# Patient Record
Sex: Female | Born: 1965 | Race: White | Hispanic: No | Marital: Married | Smoking: Former smoker
Health system: Southern US, Community
[De-identification: ages and names within clinical notes are randomized; demographics above are authoritative.]

## PROBLEM LIST (undated history)

## (undated) DIAGNOSIS — E079 Disorder of thyroid, unspecified: Secondary | ICD-10-CM

## (undated) DIAGNOSIS — F32A Depression, unspecified: Secondary | ICD-10-CM

## (undated) DIAGNOSIS — R51 Headache: Secondary | ICD-10-CM

## (undated) DIAGNOSIS — E785 Hyperlipidemia, unspecified: Secondary | ICD-10-CM

## (undated) DIAGNOSIS — R519 Headache, unspecified: Secondary | ICD-10-CM

## (undated) DIAGNOSIS — T7840XA Allergy, unspecified, initial encounter: Secondary | ICD-10-CM

## (undated) DIAGNOSIS — F329 Major depressive disorder, single episode, unspecified: Secondary | ICD-10-CM

## (undated) DIAGNOSIS — I10 Essential (primary) hypertension: Secondary | ICD-10-CM

## (undated) DIAGNOSIS — K219 Gastro-esophageal reflux disease without esophagitis: Secondary | ICD-10-CM

## (undated) DIAGNOSIS — E039 Hypothyroidism, unspecified: Secondary | ICD-10-CM

## (undated) DIAGNOSIS — B019 Varicella without complication: Secondary | ICD-10-CM

## (undated) HISTORY — DX: Depression, unspecified: F32.A

## (undated) HISTORY — DX: Disorder of thyroid, unspecified: E07.9

## (undated) HISTORY — DX: Headache, unspecified: R51.9

## (undated) HISTORY — DX: Essential (primary) hypertension: I10

## (undated) HISTORY — PX: CYSTECTOMY: SUR359

## (undated) HISTORY — DX: Major depressive disorder, single episode, unspecified: F32.9

## (undated) HISTORY — DX: Allergy, unspecified, initial encounter: T78.40XA

## (undated) HISTORY — DX: Gastro-esophageal reflux disease without esophagitis: K21.9

## (undated) HISTORY — DX: Headache: R51

## (undated) HISTORY — DX: Varicella without complication: B01.9

## (undated) HISTORY — DX: Hyperlipidemia, unspecified: E78.5

---

## 2008-05-30 ENCOUNTER — Encounter: Payer: Self-pay | Admitting: Family Medicine

## 2008-05-30 ENCOUNTER — Emergency Department (HOSPITAL_COMMUNITY): Admission: EM | Admit: 2008-05-30 | Discharge: 2008-05-31 | Payer: Self-pay | Admitting: Emergency Medicine

## 2008-06-08 ENCOUNTER — Ambulatory Visit: Payer: Self-pay | Admitting: Family Medicine

## 2008-06-08 DIAGNOSIS — R32 Unspecified urinary incontinence: Secondary | ICD-10-CM | POA: Insufficient documentation

## 2008-06-08 DIAGNOSIS — R51 Headache: Secondary | ICD-10-CM

## 2008-06-08 DIAGNOSIS — K219 Gastro-esophageal reflux disease without esophagitis: Secondary | ICD-10-CM | POA: Insufficient documentation

## 2008-06-08 DIAGNOSIS — R519 Headache, unspecified: Secondary | ICD-10-CM | POA: Insufficient documentation

## 2008-06-15 ENCOUNTER — Ambulatory Visit: Payer: Self-pay | Admitting: Family Medicine

## 2008-06-16 LAB — CONVERTED CEMR LAB
Cholesterol: 196 mg/dL (ref 0–200)
LDL Cholesterol: 130 mg/dL — ABNORMAL HIGH (ref 0–99)
Triglycerides: 93 mg/dL (ref 0.0–149.0)

## 2008-06-22 ENCOUNTER — Encounter: Payer: Self-pay | Admitting: Family Medicine

## 2008-06-22 ENCOUNTER — Other Ambulatory Visit: Admission: RE | Admit: 2008-06-22 | Discharge: 2008-06-22 | Payer: Self-pay | Admitting: Family Medicine

## 2008-06-22 ENCOUNTER — Ambulatory Visit: Payer: Self-pay | Admitting: Family Medicine

## 2008-06-22 DIAGNOSIS — E039 Hypothyroidism, unspecified: Secondary | ICD-10-CM

## 2008-06-25 ENCOUNTER — Encounter: Payer: Self-pay | Admitting: Family Medicine

## 2010-06-07 LAB — CBC
MCHC: 33.6 g/dL (ref 30.0–36.0)
MCV: 95.4 fL (ref 78.0–100.0)
Platelets: 332 10*3/uL (ref 150–400)
RDW: 12.6 % (ref 11.5–15.5)

## 2010-06-07 LAB — COMPREHENSIVE METABOLIC PANEL
AST: 17 U/L (ref 0–37)
Albumin: 3.4 g/dL — ABNORMAL LOW (ref 3.5–5.2)
CO2: 27 mEq/L (ref 19–32)
Calcium: 8.9 mg/dL (ref 8.4–10.5)
Creatinine, Ser: 0.86 mg/dL (ref 0.4–1.2)
GFR calc Af Amer: >60 mL/min (ref >60)
GFR calc non Af Amer: >60 mL/min (ref >60)
Total Protein: 6.3 g/dL (ref 6.0–8.3)

## 2010-06-07 LAB — URINALYSIS, ROUTINE W REFLEX MICROSCOPIC
Bilirubin Urine: NEGATIVE
Ketones, ur: NEGATIVE mg/dL
Nitrite: NEGATIVE
Protein, ur: NEGATIVE mg/dL
Urobilinogen, UA: 0.2 mg/dL (ref 0.0–1.0)

## 2010-06-07 LAB — DIFFERENTIAL
Eosinophils Absolute: 0.2 10*3/uL (ref 0.0–0.7)
Lymphocytes Relative: 21 % (ref 12–46)
Lymphs Abs: 2 10*3/uL (ref 0.7–4.0)
Monocytes Relative: 7 % (ref 3–12)
Neutro Abs: 6.6 10*3/uL (ref 1.7–7.7)
Neutrophils Relative %: 70 % (ref 43–77)

## 2010-06-07 LAB — LIPASE, BLOOD: Lipase: 21 U/L (ref 11–59)

## 2012-07-09 ENCOUNTER — Ambulatory Visit (INDEPENDENT_AMBULATORY_CARE_PROVIDER_SITE_OTHER): Payer: BC Managed Care – PPO | Admitting: Family Medicine

## 2012-07-09 ENCOUNTER — Encounter: Payer: Self-pay | Admitting: Family Medicine

## 2012-07-09 VITALS — BP 130/80 | HR 86 | Temp 97.9°F | Ht 63.25 in | Wt 297.8 lb

## 2012-07-09 DIAGNOSIS — Z0000 Encounter for general adult medical examination without abnormal findings: Secondary | ICD-10-CM

## 2012-07-09 DIAGNOSIS — E039 Hypothyroidism, unspecified: Secondary | ICD-10-CM

## 2012-07-09 DIAGNOSIS — F341 Dysthymic disorder: Secondary | ICD-10-CM

## 2012-07-09 DIAGNOSIS — Z1231 Encounter for screening mammogram for malignant neoplasm of breast: Secondary | ICD-10-CM

## 2012-07-09 DIAGNOSIS — F418 Other specified anxiety disorders: Secondary | ICD-10-CM

## 2012-07-09 LAB — CBC WITH DIFFERENTIAL/PLATELET
Basophils Absolute: 0.1 10*3/uL (ref 0.0–0.1)
Eosinophils Absolute: 0.3 10*3/uL (ref 0.0–0.7)
HCT: 41 % (ref 36.0–46.0)
Hemoglobin: 13.9 g/dL (ref 12.0–15.0)
Lymphocytes Relative: 23.6 % (ref 12.0–46.0)
MCHC: 34 g/dL (ref 30.0–36.0)
MCV: 92.1 fl (ref 78.0–100.0)
Neutro Abs: 6.9 10*3/uL (ref 1.4–7.7)
Neutrophils Relative %: 65.5 % (ref 43.0–77.0)
Platelets: 388 10*3/uL (ref 150.0–400.0)
RBC: 4.45 Mil/uL (ref 3.87–5.11)
RDW: 13.6 % (ref 11.5–14.6)
WBC: 10.5 10*3/uL (ref 4.5–10.5)

## 2012-07-09 LAB — LIPID PANEL
Cholesterol: 211 mg/dL — ABNORMAL HIGH (ref 0–200)
Total CHOL/HDL Ratio: 4
VLDL: 22.2 mg/dL (ref 0.0–40.0)

## 2012-07-09 LAB — BASIC METABOLIC PANEL
Chloride: 104 mEq/L (ref 96–112)
Creatinine, Ser: 0.8 mg/dL (ref 0.4–1.2)
Potassium: 4.4 mEq/L (ref 3.5–5.1)

## 2012-07-09 LAB — HEPATIC FUNCTION PANEL
ALT: 17 U/L (ref 0–35)
Bilirubin, Direct: 0.1 mg/dL (ref 0.0–0.3)
Total Bilirubin: 0.5 mg/dL (ref 0.3–1.2)

## 2012-07-09 MED ORDER — VENLAFAXINE HCL ER 37.5 MG PO CP24: 37.5000 mg | ORAL_CAPSULE | Freq: Every day | ORAL | Status: DC

## 2012-07-09 NOTE — Assessment & Plan Note (Signed)
New to provider, chronic for pt.  Not currently on meds.  Check labs.  Start synthroid prn.

## 2012-07-09 NOTE — Progress Notes (Signed)
  Subjective:    Patient ID: Jody Schultz, female    DOB: 1966-01-17, 47 y.o.   MRN: [id number]  HPI New to establish.  Previous MD- Clent Ridges (last seen 4+ yrs ago)  Pap- 4 yrs ago (due but having period) Mammo- never  Obesity- pt reports she's 'working really really hard'.  Has lost 15 lbs.  Walking 'at least 3x/week', weight watchers online.  Has hx of hypothyroid, not currently on meds.  Sleep disturbance- 'i can fall asleep fine but i can't stay asleep'.  This has been an issue for years.  In the past month, things have seemingly improved.  Pt is concerned that menopause is playing a role.  Periods are changing in consistency and duration x6 months.  Pt admits to frequent tearfulness, short tempered, anxious- this has been an ongoing issue.  No hot flashes.  + low energy.   Review of Systems Patient reports no vision/hearing changes, adenopathy,fever, weight change,  persistant/recurrent hoarseness , swallowing issues, chest pain, palpitations, edema, persistant/recurrent cough, hemoptysis, dyspnea (rest/exertional/paroxysmal nocturnal), gastrointestinal bleeding (melena, rectal bleeding), abdominal pain, significant heartburn, bowel changes, GU symptoms (dysuria, hematuria, incontinence), Gyn symptoms (abnormal  bleeding, pain),  syncope, focal weakness, memory loss, numbness & tingling, skin/hair/nail changes, abnormal bruising or bleeding.     Objective:   Physical Exam General Appearance:    Alert, cooperative, no distress, appears stated age  Head:    Normocephalic, without obvious abnormality, atraumatic  Eyes:    PERRL, conjunctiva/corneas clear, EOM's intact, fundi    benign, both eyes  Ears:    Normal TM's and external ear canals, both ears  Nose:   Nares normal, septum midline, mucosa normal, no drainage    or sinus tenderness  Throat:   Lips, mucosa, and tongue normal; teeth and gums normal  Neck:   Supple, symmetrical, trachea midline, no adenopathy;    Thyroid: no  enlargement/tenderness/nodules  Back:     Symmetric, no curvature, ROM normal, no CVA tenderness  Lungs:     Clear to auscultation bilaterally, respirations unlabored  Chest Wall:    No tenderness or deformity   Heart:    Regular rate and rhythm, S1 and S2 normal, no murmur, rub   or gallop  Breast Exam:    Deferred to GYN  Abdomen:     Soft, non-tender, bowel sounds active all four quadrants,    no masses, no organomegaly  Genitalia:    Deferred to GYN  Rectal:    Extremities:   Extremities normal, atraumatic, no cyanosis or edema  Pulses:   2+ and symmetric all extremities  Skin:   Skin color, texture, turgor normal, no rashes or lesions  Lymph nodes:   Cervical, supraclavicular, and axillary nodes normal  Neurologic:   CNII-XII intact, normal strength, sensation and reflexes    throughout          Assessment & Plan:

## 2012-07-09 NOTE — Assessment & Plan Note (Addendum)
New.  Pt tearful when talking about her recent mood and sleep difficulties.  This has been ongoing for years.  Has never been addressed.  Start SNRI.  Will follow closely.

## 2012-07-09 NOTE — Patient Instructions (Addendum)
Follow up in 4-6 weeks for pap We'll notify you of your lab results Start the Venlafaxine daily for mood Keep up the good work on the weight loss Call with any questions or concerns Hang in there!

## 2012-07-09 NOTE — Assessment & Plan Note (Signed)
Pt's PE WNL w/ exception of obesity.  Check labs.  Refer for mammo.  Will do pap and breast exam at upcoming visit due to menses.  Anticipatory guidance provided.

## 2012-07-09 NOTE — Assessment & Plan Note (Signed)
New.  Pt has been working on [employer] and exercise but is having difficulty losing weight.  Check TSH to r/o thyroid abnormality.  Applauded recent efforts.  Will follow.

## 2012-07-10 ENCOUNTER — Telehealth: Payer: Self-pay | Admitting: *Deleted

## 2012-07-10 DIAGNOSIS — E039 Hypothyroidism, unspecified: Secondary | ICD-10-CM

## 2012-07-10 DIAGNOSIS — E785 Hyperlipidemia, unspecified: Secondary | ICD-10-CM

## 2012-07-10 MED ORDER — LEVOTHYROXINE SODIUM 100 MCG PO TABS: 100.0000 ug | ORAL_TABLET | Freq: Every day | ORAL | Status: DC

## 2012-07-10 NOTE — Telephone Encounter (Signed)
Message copied by Verdie Shire on Thu Jul 10, 2012  3:15 PM ------      Message from: Sheliah Hatch      Created: Wed Jul 09, 2012  1:03 PM       TSH is at upper limits of normal- based on this, should start Synthroid daily      Total cholesterol and LDL are elevated but this should improve as thyroid level does.  Will repeat in 6 months      B12 is at low end- should start oral supplement daily      Remainder of labs look good ------

## 2012-07-10 NOTE — Telephone Encounter (Signed)
Spoke with the pt and informed her of recent lab results and recommendations per Dr. Beverely Low.  Pt understood and agreed.  New rx sent to the pharmacy by e-script, and labs ordered and sent.  Pt will need repeat TSH in 3 mos and repeat Lipid and LFT's in 6 mos. //AB/CMA

## 2012-07-13 LAB — VITAMIN D 1,25 DIHYDROXY: Vitamin D2 1, 25 (OH)2: <8 pg/mL

## 2012-07-14 ENCOUNTER — Encounter: Payer: Self-pay | Admitting: Family Medicine

## 2012-07-18 ENCOUNTER — Ambulatory Visit: Payer: Self-pay | Admitting: Family Medicine

## 2012-08-07 ENCOUNTER — Encounter: Payer: Self-pay | Admitting: Family Medicine

## 2012-08-07 DIAGNOSIS — F418 Other specified anxiety disorders: Secondary | ICD-10-CM

## 2012-08-07 NOTE — Telephone Encounter (Signed)
Ok to increase to 75mg  Venlafaxine daily, #30, 3 refills

## 2012-08-07 NOTE — Telephone Encounter (Signed)
Please advise.//AB/CMA 

## 2012-08-12 NOTE — Telephone Encounter (Signed)
Pt called stating she is completely out of this medication. Can we please send this to [facility] [address]?

## 2012-08-14 MED ORDER — VENLAFAXINE HCL ER 37.5 MG PO CP24: 75.0000 mg | ORAL_CAPSULE | Freq: Every day | ORAL | Status: DC

## 2012-08-14 NOTE — Telephone Encounter (Signed)
Rx sent 

## 2012-08-25 ENCOUNTER — Other Ambulatory Visit (HOSPITAL_COMMUNITY)
Admission: RE | Admit: 2012-08-25 | Discharge: 2012-08-25 | Disposition: A | Discharge status: Home / Self Care | Payer: BC Managed Care – PPO | Source: Ambulatory Visit | Attending: Family Medicine | Admitting: Family Medicine

## 2012-08-25 ENCOUNTER — Ambulatory Visit (INDEPENDENT_AMBULATORY_CARE_PROVIDER_SITE_OTHER): Payer: BC Managed Care – PPO | Admitting: Family Medicine

## 2012-08-25 ENCOUNTER — Encounter: Payer: Self-pay | Admitting: Family Medicine

## 2012-08-25 ENCOUNTER — Ambulatory Visit
Admission: RE | Admit: 2012-08-25 | Discharge: 2012-08-25 | Disposition: A | Discharge status: Home / Self Care | Payer: BC Managed Care – PPO | Source: Ambulatory Visit | Attending: Family Medicine | Admitting: Family Medicine

## 2012-08-25 VITALS — BP 160/90 | HR 75 | Temp 98.1°F | Ht 63.25 in | Wt 299.0 lb

## 2012-08-25 DIAGNOSIS — R030 Elevated blood-pressure reading, without diagnosis of hypertension: Secondary | ICD-10-CM

## 2012-08-25 DIAGNOSIS — F418 Other specified anxiety disorders: Secondary | ICD-10-CM

## 2012-08-25 DIAGNOSIS — Z1151 Encounter for screening for human papillomavirus (HPV): Secondary | ICD-10-CM | POA: Insufficient documentation

## 2012-08-25 DIAGNOSIS — Z01419 Encounter for gynecological examination (general) (routine) without abnormal findings: Secondary | ICD-10-CM | POA: Insufficient documentation

## 2012-08-25 DIAGNOSIS — F341 Dysthymic disorder: Secondary | ICD-10-CM

## 2012-08-25 DIAGNOSIS — Z124 Encounter for screening for malignant neoplasm of cervix: Secondary | ICD-10-CM

## 2012-08-25 DIAGNOSIS — Z1231 Encounter for screening mammogram for malignant neoplasm of breast: Secondary | ICD-10-CM

## 2012-08-25 MED ORDER — VENLAFAXINE HCL ER 75 MG PO TB24: 1.0000 | ORAL_TABLET | Freq: Every day | ORAL | Status: DC

## 2012-08-25 NOTE — Progress Notes (Signed)
  Subjective:    Patient ID: Jody Schultz, female    DOB: 1965/06/25, 47 y.o.   MRN: [id number]  HPI Breast exam/pap- due today.  Pt used OTC monistat cream last night due to yeast infxn from recent abx use (multiple styes)  Depression/anxiety- 'huge stress in my life'.  Father is not doing well, work and home are stressful.  Pt is not exercising, eating well.  Pt wanted to increase meds to 75mg - this was approved on 6/12 but due to prescription problems, she remains on 37.5mg  daily.  Elevated BP- pt reports this is due to increased stress.  Denies CP, SOB, visual changes, edema.  + HAs   Review of Systems For ROS see HPI     Objective:   Physical Exam  Vitals reviewed. Constitutional: She appears well-developed and well-nourished. No distress.  Cardiovascular: Normal rate, regular rhythm and normal heart sounds.   Pulmonary/Chest: Effort normal and breath sounds normal. No respiratory distress. She has no wheezes. She has no rales. Right breast exhibits no inverted nipple, no mass, no nipple discharge, no skin change and no tenderness. Left breast exhibits no inverted nipple, no mass, no nipple discharge, no skin change and no tenderness.  Genitourinary: Vagina normal and uterus normal. No breast swelling, tenderness, discharge or bleeding. There is no rash, tenderness or lesion on the right labia. There is no rash, tenderness or lesion on the left labia. Uterus is not deviated, not enlarged, not fixed and not tender. Cervix exhibits no motion tenderness, no discharge and no friability. Right adnexum displays no mass, no tenderness and no fullness. Left adnexum displays no mass, no tenderness and no fullness. No erythema or tenderness around the vagina. No foreign body around the vagina. No vaginal discharge found.  Skin: Skin is warm and dry.  Psychiatric:  anxious          Assessment & Plan:

## 2012-08-25 NOTE — Assessment & Plan Note (Signed)
New.  Asymptomatic.  Pt feels this is stress related.  Will have pt monitor BP at home/work and call if persistently >140/90.  Pt expressed understanding and is in agreement w/ plan.

## 2012-08-25 NOTE — Patient Instructions (Addendum)
Follow up in 3 months to recheck depression, weight loss We'll notify you of your pap results Increase the Effexor to 75mg  daily- 2 of your current script and 1 of the new prescription Call with any questions or concerns Hang in there!

## 2012-08-25 NOTE — Assessment & Plan Note (Signed)
Unchanged.  Pt did not increase dose as directed.  New script sent.  Will follow.

## 2012-08-25 NOTE — Assessment & Plan Note (Signed)
Pap collected. 

## 2012-08-28 ENCOUNTER — Other Ambulatory Visit: Payer: Self-pay | Admitting: Medical

## 2012-08-28 ENCOUNTER — Other Ambulatory Visit: Payer: Self-pay | Admitting: Family Medicine

## 2012-08-28 DIAGNOSIS — R928 Other abnormal and inconclusive findings on diagnostic imaging of breast: Secondary | ICD-10-CM

## 2012-09-01 ENCOUNTER — Ambulatory Visit
Admission: RE | Admit: 2012-09-01 | Discharge: 2012-09-01 | Disposition: A | Discharge status: Home / Self Care | Payer: BC Managed Care – PPO | Source: Ambulatory Visit | Attending: Family Medicine | Admitting: Family Medicine

## 2012-09-01 DIAGNOSIS — R928 Other abnormal and inconclusive findings on diagnostic imaging of breast: Secondary | ICD-10-CM

## 2012-09-09 ENCOUNTER — Other Ambulatory Visit: Payer: BC Managed Care – PPO

## 2012-09-11 ENCOUNTER — Other Ambulatory Visit: Payer: BC Managed Care – PPO

## 2013-01-01 ENCOUNTER — Other Ambulatory Visit: Payer: Self-pay

## 2013-04-11 ENCOUNTER — Other Ambulatory Visit: Payer: Self-pay | Admitting: Family Medicine

## 2013-04-11 NOTE — Telephone Encounter (Signed)
Med filled.  

## 2016-09-19 ENCOUNTER — Emergency Department (HOSPITAL_COMMUNITY): Payer: 59

## 2016-09-19 ENCOUNTER — Other Ambulatory Visit: Payer: Self-pay

## 2016-09-19 ENCOUNTER — Observation Stay (HOSPITAL_BASED_OUTPATIENT_CLINIC_OR_DEPARTMENT_OTHER): Payer: 59

## 2016-09-19 ENCOUNTER — Encounter (HOSPITAL_COMMUNITY): Payer: Self-pay | Admitting: Emergency Medicine

## 2016-09-19 ENCOUNTER — Other Ambulatory Visit: Payer: Self-pay | Admitting: Physician Assistant

## 2016-09-19 ENCOUNTER — Observation Stay (HOSPITAL_COMMUNITY)
Admission: EM | Admit: 2016-09-19 | Discharge: 2016-09-19 | Disposition: A | Discharge status: Home / Self Care | Payer: 59 | Attending: Internal Medicine | Admitting: Internal Medicine

## 2016-09-19 DIAGNOSIS — R030 Elevated blood-pressure reading, without diagnosis of hypertension: Secondary | ICD-10-CM | POA: Diagnosis present

## 2016-09-19 DIAGNOSIS — K219 Gastro-esophageal reflux disease without esophagitis: Secondary | ICD-10-CM | POA: Diagnosis present

## 2016-09-19 DIAGNOSIS — F329 Major depressive disorder, single episode, unspecified: Secondary | ICD-10-CM | POA: Insufficient documentation

## 2016-09-19 DIAGNOSIS — R079 Chest pain, unspecified: Principal | ICD-10-CM | POA: Diagnosis present

## 2016-09-19 DIAGNOSIS — Z87891 Personal history of nicotine dependence: Secondary | ICD-10-CM | POA: Diagnosis not present

## 2016-09-19 DIAGNOSIS — I10 Essential (primary) hypertension: Secondary | ICD-10-CM | POA: Insufficient documentation

## 2016-09-19 DIAGNOSIS — E038 Other specified hypothyroidism: Secondary | ICD-10-CM

## 2016-09-19 DIAGNOSIS — Z79899 Other long term (current) drug therapy: Secondary | ICD-10-CM | POA: Diagnosis not present

## 2016-09-19 DIAGNOSIS — M79602 Pain in left arm: Secondary | ICD-10-CM | POA: Diagnosis present

## 2016-09-19 DIAGNOSIS — R072 Precordial pain: Secondary | ICD-10-CM

## 2016-09-19 DIAGNOSIS — E039 Hypothyroidism, unspecified: Secondary | ICD-10-CM | POA: Diagnosis present

## 2016-09-19 LAB — CREATININE, SERUM
Creatinine, Ser: 0.79 mg/dL (ref 0.44–1.00)
GFR calc Af Amer: >60 mL/min (ref >60)

## 2016-09-19 LAB — BASIC METABOLIC PANEL
Anion gap: 8 (ref 5–15)
BUN: 16 mg/dL (ref 6–20)
CHLORIDE: 104 mmol/L (ref 101–111)
CO2: 26 mmol/L (ref 22–32)
Calcium: 9.1 mg/dL (ref 8.9–10.3)
Creatinine, Ser: 0.81 mg/dL (ref 0.44–1.00)
GFR calc non Af Amer: >60 mL/min (ref >60)
Glucose, Bld: 118 mg/dL — ABNORMAL HIGH (ref 65–99)
POTASSIUM: 4.2 mmol/L (ref 3.5–5.1)
SODIUM: 138 mmol/L (ref 135–145)

## 2016-09-19 LAB — TROPONIN I
TROPONIN I: 0.07 ng/mL — AB (ref <0.03)
Troponin I: <0.03 ng/mL (ref <0.03)

## 2016-09-19 LAB — CBC
HCT: 40.1 % (ref 36.0–46.0)
HEMATOCRIT: 43 % (ref 36.0–46.0)
HEMOGLOBIN: 13.9 g/dL (ref 12.0–15.0)
Hemoglobin: 13.1 g/dL (ref 12.0–15.0)
MCH: 30 pg (ref 26.0–34.0)
MCH: 30.1 pg (ref 26.0–34.0)
MCHC: 32.3 g/dL (ref 30.0–36.0)
MCHC: 32.7 g/dL (ref 30.0–36.0)
MCV: 92.7 fL (ref 78.0–100.0)
MCV: 92.2 fL (ref 78.0–100.0)
PLATELETS: 325 10*3/uL (ref 150–400)
Platelets: 336 10*3/uL (ref 150–400)
RBC: 4.64 MIL/uL (ref 3.87–5.11)
RBC: 4.35 MIL/uL (ref 3.87–5.11)
RDW: 13.1 % (ref 11.5–15.5)
RDW: 13.2 % (ref 11.5–15.5)
WBC: 11.8 10*3/uL — AB (ref 4.0–10.5)
WBC: 10.1 10*3/uL (ref 4.0–10.5)

## 2016-09-19 LAB — HIV ANTIBODY (ROUTINE TESTING W REFLEX): HIV SCREEN 4TH GENERATION: NONREACTIVE

## 2016-09-19 LAB — ECHOCARDIOGRAM COMPLETE
Height: 62 in
WEIGHTICAEL: 4640 [oz_av]

## 2016-09-19 LAB — T4, FREE: Free T4: 0.68 ng/dL (ref 0.61–1.12)

## 2016-09-19 LAB — I-STAT TROPONIN, ED: Troponin i, poc: 0 ng/mL (ref 0.00–0.08)

## 2016-09-19 LAB — TSH: TSH: 18.05 u[IU]/mL — ABNORMAL HIGH (ref 0.350–4.500)

## 2016-09-19 MED ORDER — ASPIRIN EC 325 MG PO TBEC
325.0000 mg | DELAYED_RELEASE_TABLET | Freq: Every day | ORAL | Status: DC
  Administered 2016-09-19: 325 mg via ORAL
  Filled 2016-09-19: qty 1

## 2016-09-19 MED ORDER — ENOXAPARIN SODIUM 40 MG/0.4ML ~~LOC~~ SOLN: 40.0000 mg | SUBCUTANEOUS | Status: DC

## 2016-09-19 MED ORDER — AMLODIPINE BESYLATE 5 MG PO TABS: 5.0000 mg | ORAL_TABLET | Freq: Every day | ORAL | 0 refills | Status: AC

## 2016-09-19 MED ORDER — LEVOTHYROXINE SODIUM 50 MCG PO TABS
50.0000 ug | ORAL_TABLET | Freq: Every day | ORAL | 0 refills | Status: DC
Start: 2016-09-20 — End: 2019-06-24

## 2016-09-19 MED ORDER — NITROGLYCERIN 0.4 MG SL SUBL: 0.4000 mg | SUBLINGUAL_TABLET | SUBLINGUAL | 0 refills | Status: DC | PRN

## 2016-09-19 MED ORDER — ACETAMINOPHEN 325 MG PO TABS: 650.0000 mg | ORAL_TABLET | ORAL | Status: DC | PRN

## 2016-09-19 MED ORDER — ASPIRIN 81 MG PO CHEW
324.0000 mg | CHEWABLE_TABLET | Freq: Once | ORAL | Status: AC
  Administered 2016-09-19: 324 mg via ORAL
  Filled 2016-09-19: qty 4

## 2016-09-19 MED ORDER — LEVOTHYROXINE SODIUM 50 MCG PO TABS
50.0000 ug | ORAL_TABLET | Freq: Every day | ORAL | Status: DC
  Administered 2016-09-19: 50 ug via ORAL
  Filled 2016-09-19 (×2): qty 1

## 2016-09-19 MED ORDER — ONDANSETRON HCL 4 MG/2ML IJ SOLN: 4.0000 mg | Freq: Four times a day (QID) | INTRAMUSCULAR | Status: DC | PRN

## 2016-09-19 MED ORDER — ASPIRIN EC 81 MG PO TBEC: 81.0000 mg | DELAYED_RELEASE_TABLET | Freq: Every day | ORAL | 0 refills | Status: DC

## 2016-09-19 MED ORDER — NITROGLYCERIN 0.4 MG SL SUBL
0.4000 mg | SUBLINGUAL_TABLET | SUBLINGUAL | Status: DC | PRN
  Administered 2016-09-19: 0.4 mg via SUBLINGUAL
  Filled 2016-09-19 (×2): qty 1

## 2016-09-19 NOTE — Progress Notes (Addendum)
[facility] PROGRESS NOTE  Donna Snooks LZJ:[id number] DOB: 09-Jul-1965 DOA: 09/19/2016  PCP: Midge Minium, MD  Brief History/Interval Summary: 51 year old Caucasian female with a past medical history of hypothyroidism who has not taken her medications in over a year, presented with complaints of chest tightness with pain in the left arm. Patient without any known history of coronary artery disease. She is obese. Noted to have high blood pressure. She was hospitalized for further management.  Reason for Visit: Chest tightness and left arm pain.  Consultants: Cardiology  Procedures: Transthoracic echocardiogram is pending  Antibiotics: None  Subjective/Interval History: Patient feels better this morning. Denies any chest tightness currently. No shortness of breath. No nausea, vomiting currently.  ROS: Denies any headaches.  Objective:  Vital Signs  Vitals:   09/19/16 0600 09/19/16 0615 09/19/16 0726 09/19/16 1030  BP: 133/66 138/72 137/74 (!) 146/76  Pulse: 71 72 77 74  Resp: (!) 22 19 (!) 21 20  Temp:      TempSrc:      SpO2: 97% 96% 98% 97%  Weight:      Height:       No intake or output data in the 24 hours ending 09/19/16 1133 Filed Weights   09/19/16 0305  Weight: 131.5 kg (290 lb)    General appearance: alert, cooperative, appears stated age, no distress and moderately obese Resp: clear to auscultation bilaterally Cardio: regular rate and rhythm, S1, S2 normal, no murmur, click, rub or gallop GI: soft, non-tender; bowel sounds normal; no masses,  no organomegaly Extremities: extremities normal, atraumatic, no cyanosis or edema Neurologic: Awake and alert. Oriented 3. No focal neurological deficits noted.  Lab Results:  Data Reviewed: I have personally reviewed following labs and imaging studies  CBC:  Recent Labs Lab 09/19/16 0312 09/19/16 0637  WBC 11.8* 10.1  HGB 13.9 13.1  HCT 43.0 40.1  MCV 92.7 92.2  PLT 336 325    Basic  Metabolic Panel:  Recent Labs Lab 09/19/16 0312 09/19/16 0603  NA 138  --   K 4.2  --   CL 104  --   CO2 26  --   GLUCOSE 118*  --   BUN 16  --   CREATININE 0.81 0.79  CALCIUM 9.1  --     GFR: Estimated Creatinine Clearance: 107.4 mL/min (by C-G formula based on SCr of 0.79 mg/dL).  Cardiac Enzymes:  Recent Labs Lab 09/19/16 0603  TROPONINI 0.07*    Thyroid Function Tests:  Recent Labs  09/19/16 0603  TSH 18.050*     Radiology Studies: Dg Chest 2 View  Result Date: 09/19/2016 CLINICAL DATA:  Dyspnea and hypertension tonight. Left upper extremity pain. EXAM: CHEST  2 VIEW COMPARISON:  None. FINDINGS: The lungs are clear except for mild curvilinear scarring or atelectasis in the left base. The pulmonary vasculature is normal. Heart size is normal. Hilar and mediastinal contours are unremarkable. There is no pleural effusion. IMPRESSION: No active cardiopulmonary disease. Electronically Signed   By: Andreas Newport M.D.   On: 09/19/2016 03:45     Medications:  Scheduled: . aspirin EC  325 mg Oral Daily  . enoxaparin (LOVENOX) injection  40 mg Subcutaneous Q24H  . levothyroxine  50 mcg Oral Daily   Continuous:  KWI:[id number], nitroGLYCERIN, ondansetron (ZOFRAN) IV  Assessment/Plan:  Principal Problem:   Chest pain Active Problems:   Elevated blood pressure reading    Chest tightness with left arm pain. Symptoms improved with sublingual nitroglycerin. Initial troponin was  normal. Second one is 0.07. Waiting for subsequent troponin levels. Cardiology has been consulted. Continue aspirin. If subsequent troponin level is also high we will initiate IV heparin. Patient is currently symptom free. EKG did not show any concerning ischemic changes. She will benefit from further cardiac ischemic testing. Echocardiogram is pending. Check lipid panel.  Elevated blood pressure. No known history of hypertension. Blood pressures have improved. Continue to monitor  for now.  History of hypothyroidism. She has been off of her medications for over a year. She hasn't seen any physicians in 2 years. TSH noted to be 18. Free T4 is pending. Reinitiate Synthroid.  Morbid obesity Body mass index is 53.04 kg/m.   DVT Prophylaxis: Lovenox    Code Status: Full code  Family Communication: Discussed with the patient  Disposition Plan: Management as outlined above. Await cardiology input. Leave nothing by mouth for now.    LOS: 0 days   [facility] Pager [phone] 09/19/2016, 11:33 AM  If 7PM-7AM, please contact night-coverage at [email or web address], password [facility]

## 2016-09-19 NOTE — Progress Notes (Signed)
*  PRELIMINARY RESULTS* Echocardiogram 2D Echocardiogram has been performed.  Leavy Cella 09/19/2016, 3:17 PM

## 2016-09-19 NOTE — ED Notes (Signed)
Pt ambulated to restroom with no difficulties or other issues. Pt states she feels fine.

## 2016-09-19 NOTE — Progress Notes (Signed)
Discharge instructions included the following:  Discharge medications and possible side effects, follow-up appointments, when to call the physician, dietary suggestions, activity suggestions, and exit care documentation.  Verification of comprehension ascertained by "teach-back" method.  Patient discharged to home via private vehicle with husband.  Escorted to exit via wheelchair accompanied by nurse.

## 2016-09-19 NOTE — ED Provider Notes (Signed)
By signing my name below, I, Ephriam Jenkins, attest that this documentation has been prepared under the direction and in the presence of Liliann File, Delice Bison, DO. Electronically signed, Ephriam Jenkins, ED Scribe. 09/19/16. 3:50 AM.  TIME SEEN: 3:50 AM  CHIEF COMPLAINT: Left arm pain/chest tightness  HPI:  HPI Comments: Jody Schultz is a 51 y.o. female with history of hypertension, obesity, previous tobacco use who presents to the Emergency Department complaining of waxing and waning left arm pain that started just prior to arrival. Pt was woken up from her sleep this evening with pain in her left arm which radiates into her left shoulder and chest, described as "tightness". She stood up and felt slightly dizzy and nauseous. Pt then used her husbands BP machine and reports that her blood pressure was 220/110. Pt describes a feeling of chest tightness but no significant pain currently. Pt has not seen a primary care doctor in almost 2 years, denies any cardiac Hx. No known family Hx of heart disease. Pt is not a current smoker, quite 25 years ago. No previous stress test or cardiac cath. No shortness of breath or diaphoresis. No history of PE or DVT. No lower extremity swelling or pain.  ROS: See HPI Constitutional: no fever  Eyes: no drainage  ENT: no runny nose   Cardiovascular:  + chest pain(tightness)  Resp: no SOB  GI: no vomiting GU: no dysuria Integumentary: no rash  Allergy: no hives  Musculoskeletal: no leg swelling  Neurological: no slurred speech ROS otherwise negative  PAST MEDICAL HISTORY/PAST SURGICAL HISTORY:  Past Medical History:  Diagnosis Date  . Chicken pox   . Depression   . Increased frequency of headaches   . Thyroid disease     MEDICATIONS:  Prior to Admission medications   Medication Sig Start Date End Date Taking? Authorizing Provider  levothyroxine (SYNTHROID, LEVOTHROID) 100 MCG tablet Take 1 tablet (100 mcg total) by mouth daily. 07/10/12   Midge Minium,  MD  venlafaxine XR (EFFEXOR-XR) 37.5 MG 24 hr capsule TAKE TWO CAPSULES BY MOUTH DAILY  04/11/13   Midge Minium, MD    ALLERGIES:  No Known Allergies  SOCIAL HISTORY:  Social History  Substance Use Topics  . Smoking status: Former Smoker    Types: Cigarettes    Quit date: 01/27/1988  . Smokeless tobacco: Not on file  . Alcohol use Yes     Comment: social    FAMILY HISTORY: Family History  Problem Relation Age of Onset  . Arthritis Mother   . Stroke Father   . Cancer Maternal Grandmother   . Cancer Paternal Grandmother     EXAM: BP (!) 168/107 (BP Location: Right Arm)   Pulse 86   Temp 98.3 F (36.8 C) (Oral)   Resp (!) 24   Ht 5\' 2"  (1.575 m)   Wt 290 lb (131.5 kg)   LMP 08/20/2016 (Approximate)   SpO2 99%   BMI 53.04 kg/m  CONSTITUTIONAL: Morbidly obese, alert and oriented and responds appropriately to questions. Appears uncomfortable HEAD: Normocephalic EYES: Conjunctivae clear, pupils appear equal, EOMI ENT: normal nose; moist mucous membranes NECK: Supple, no meningismus, no nuchal rigidity, no LAD  CARD: RRR; S1 and S2 appreciated; no murmurs, no clicks, no rubs, no gallops, 2+ radial pulses bilaterally RESP: Normal chest excursion without splinting or tachypnea; breath sounds clear and equal bilaterally; no wheezes, no rhonchi, no rales, no hypoxia or respiratory distress, speaking full sentences ABD/GI: Normal bowel sounds; non-distended; soft, non-tender, no  rebound, no guarding, no peritoneal signs, no hepatosplenomegaly BACK:  The back appears normal and is non-tender to palpation, there is no CVA tenderness EXT: Normal ROM in all joints; non-tender to palpation; no edema; normal capillary refill; no cyanosis, no calf tenderness or swelling    SKIN: Normal color for age and race; warm; no rash NEURO: Moves all extremities equally PSYCH: The patient's mood and manner are appropriate. Grooming and personal hygiene are appropriate.  MEDICAL DECISION  MAKING: Patient here with chest pain with left arm tightness, nausea, dizziness. Has history of tobacco use, obesity, hypertension. Her heart score is 4. We'll give aspirin, nitroglycerin. Will obtain cardiac labs. Doubt dissection or PE at this time.  ED PROGRESS: Patient is pain-free after nitroglycerin. Troponin negative. Chest x-ray clear. EKG shows no ischemic abnormality. Her heart score 4 with no recent provocative testing I have recommended admission for chest pain rule out and patient and husband agree. Discussed with medicine.   4:20 AM Discussed patient's case with hospitalist, Dr. Hal Hope.  I have recommended admission and patient (and family if present) agree with this plan. Admitting physician will place admission orders.   I reviewed all nursing notes, vitals, pertinent previous records, EKGs, lab and urine results, imaging (as available).    EKG Interpretation  Date/Time:  Wednesday September 19 2016 02:57:39 EDT Ventricular Rate:  85 PR Interval:  138 QRS Duration: 82 QT Interval:  368 QTC Calculation: 437 R Axis:   48 Text Interpretation:  Normal sinus rhythm Normal ECG No old tracing to compare Confirmed by Kupono Marling, Cyril Mourning [phone]) on 09/19/2016 3:16:09 AM       This chart was scribed in my presence and reviewed by me personally.    Nayeli Calvert, Delice Bison, DO 09/19/16 [phone]

## 2016-09-19 NOTE — ED Notes (Signed)
Pt reports L sided CP with radiation to L arm that started "a few hrs ago" Pt also reports SOB and nausea. Checked BP and home and it was high, noted to be hypertensive in triage. States she does not have a hx, but also states she has not been to see a doctor in several years

## 2016-09-19 NOTE — Progress Notes (Signed)
Patient ambulated approximately 350 feet without symptoms.  B/P post ambulation = 186/65.  Pulse = 87 (NSR).  No SOB noted.  Gait steady.  Dr. Maryland Pink notified of progress.

## 2016-09-19 NOTE — Consult Note (Signed)
Cardiology Consultation:   Patient ID: Jody Schultz; [id number]; [birth year]   Admit date: 09/19/2016 Date of Consult: 09/19/2016  Primary Care Provider: Midge Minium, MD Primary Cardiologist: Jody Schultz (new)   Patient Profile:   Jody Schultz is a 51 y.o. female with a hx of obesity and hypothyroidism who is being seen today for the evaluation of chest pain at the request of Jody Schultz..  History of Present Illness:   Jody Schultz is a 51 y/o morbidly obese Caucasian female with no prior history of CAD. Chest pain, or prior cardiac work up. She presented to the ED 3 am with complaints of Lt arm pain and chest tightness. The pt says she noted Lt arm discomfort last night when she went to bed. She could not get comfortable. She then developed some dyspnea and chest "tightness". 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 She says her symptoms were improved in the ED after NTG x 2.  Her EKG shows no acute changes. Her POC was negative but her second Troponin was 0.07, and 3d Troponin was negative.    Past Medical History:  Diagnosis Date  . Chicken pox   . Depression   . Increased frequency of headaches   . Thyroid disease     Past Surgical History:  Procedure Laterality Date  . CESAREAN SECTION    . CYSTECTOMY       Inpatient Medications: Scheduled Meds: . aspirin EC  325 mg Oral Daily  . enoxaparin (LOVENOX) injection  40 mg Subcutaneous Q24H  . levothyroxine  50 mcg Oral QAC breakfast   Continuous Infusions:  PRN Meds: acetaminophen, nitroGLYCERIN, ondansetron (ZOFRAN) IV  Allergies:   No Known Allergies  Social History:   Social History   Social History  . Marital status: Married    Spouse name: N/A  . Number of children: N/A  . Years of education: N/A   Occupational History  . Not on file.   Social History Main Topics  . Smoking status: Former Smoker    Types: Cigarettes    Quit date: 01/27/1988  .  Smokeless tobacco: Never Used  . Alcohol use Yes     Comment: social  . Drug use: No  . Sexual activity: Not on file   Other Topics Concern  . Not on file   Social History Narrative  . No narrative on file    Family History:   The patient's family history includes Arthritis in her mother; Cancer in her maternal grandmother and paternal grandmother; Stroke in her father.  ROS:  Please see the history of present illness.  Review of Systems  Constitution: Negative for decreased appetite, weakness and malaise/fatigue.  HENT: Negative for congestion and nosebleeds.   Cardiovascular: Positive for chest pain. Negative for claudication, irregular heartbeat, near-syncope, orthopnea, palpitations, paroxysmal nocturnal dyspnea and syncope.  Gastrointestinal: Negative for bloating and constipation.  Neurological: Negative for dizziness, light-headedness and loss of balance.  Psychiatric/Behavioral: Negative for altered mental status, depression, substance abuse, suicidal ideas and thoughts of violence. The patient is not nervous/anxious.   All other systems reviewed and are negative.   All other ROS reviewed and negative.     Physical Exam/Data:   Vitals:   09/19/16 0600 09/19/16 0615 09/19/16 0726 09/19/16 1030  BP: 133/66 138/72 137/74 (!) 146/76  Pulse: 71 72 77 74  Resp: (!) 22 19 (!) 21 20  Temp:      TempSrc:      SpO2:  97% 96% 98% 97%  Weight:      Height:       No intake or output data in the 24 hours ending 09/19/16 1307 Filed Weights   09/19/16 0305  Weight: 290 lb (131.5 kg)   Body mass index is 53.04 kg/m.  General:  Obese female, NAD HEENT: normal Lymph: no adenopathy Neck: large neck, no bruit Vascular: No carotid bruits; FA pulses 2+ bilaterally without bruits  Cardiac:  normal S1, S2; RRR; no murmur  Lungs:  clear to auscultation bilaterally, no wheezing, rhonchi or rales  Abd: obese, soft, nontender, no hepatomegaly  Ext: no edema Musculoskeletal:  No  deformities, BUE and BLE strength normal and equal Skin: warm and dry  Neuro:  CNs 2-12 intact, no focal abnormalities noted Psych:  Normal affect   EKG:  The EKG was personally reviewed and demonstrates:  NSR-no acute changes   Relevant CV Studies: Echo done- results pending  Laboratory Data:  Chemistry Recent Labs Lab 09/19/16 0312 09/19/16 0603  NA 138  --   K 4.2  --   CL 104  --   CO2 26  --   GLUCOSE 118*  --   BUN 16  --   CREATININE 0.81 0.79  CALCIUM 9.1  --   GFRNONAA >60 >60  GFRAA >60 >60  ANIONGAP 8  --     No results for input(s): PROT, ALBUMIN, AST, ALT, ALKPHOS, BILITOT in the last 168 hours. Hematology Recent Labs Lab 09/19/16 0312 09/19/16 0637  WBC 11.8* 10.1  RBC 4.64 4.35  HGB 13.9 13.1  HCT 43.0 40.1  MCV 92.7 92.2  MCH 30.0 30.1  MCHC 32.3 32.7  RDW 13.1 13.2  PLT 336 325   Cardiac Enzymes Recent Labs Lab 09/19/16 0603 09/19/16 1047  TROPONINI 0.07* <0.03    Recent Labs Lab 09/19/16 0339  TROPIPOC 0.00    BNPNo results for input(s): BNP, PROBNP in the last 168 hours.  DDimer No results for input(s): DDIMER in the last 168 hours.  Radiology/Studies:  Dg Chest 2 View  Result Date: 09/19/2016 CLINICAL DATA:  Dyspnea and hypertension tonight. Left upper extremity pain. EXAM: CHEST  2 VIEW COMPARISON:  None. FINDINGS: The lungs are clear except for mild curvilinear scarring or atelectasis in the left base. The pulmonary vasculature is normal. Heart size is normal. Hilar and mediastinal contours are unremarkable. There is no pleural effusion. IMPRESSION: No active cardiopulmonary disease. Electronically Signed   By: Andreas Newport M.D.   On: 09/19/2016 03:45    Assessment and Plan:   Chest pain with a moderate risk for acute coronary syndrome  Elevated Troponin- 0.07  Morbid obesity- BMI 52  Hypothyroidism - TSH 18, she has been off her synthroid  Plan: Will discuss with MD. Not sure a Nuclear study is feasible secondary  to her obesity. She does not have much in the way of risk factors. Review echo, check another Troponin.   Signed, Jody Ransom, PA-C  09/19/2016 1:07 PM  .  I have seen, examined and evaluated the patient this PM along with Jody Ransom, PA.  After reviewing all the available data and chart, we discussed the patients laboratory, study & physical findings as well as symptoms in detail. I agree with his findings, examination as well as impression recommendations as per our discussion.    CRFs = obesity & ~ HTN.   Her Sx of L arm pain sounds more MSK in nature, but she also noted some tightness  across the chest with walking. Relatively comfortable now. - Troponin trend is unusual & not likely indicative of ACS. Echo just read - normal EF &no WMA.  We discussed cardiac evaluation options - inpt Cath, Nuc ST or Coronary CTA.  Based upon her Sx & thinking of quality of study with her body habitus & desire to avoid false+ study, I think the best non-invasive evaluation technique would be Coronary CTA.  She is feeling relatively well now & asked if she needs to stay to have this done. With negative Troponin level, she is low risk -->   Recommend: ambulate patient in hallway -- if no recurrent Sx, OK to d/c home & we will arrange OP Cor CTA-CTFFR along with OP f/u with me to discuss results. - if + Pain - would have her stay to assess in AM.    Glenetta Hew, M.D., M.S. Interventional Cardiologist   Pager # [phone] Phone # [phone] [address]. [facility], [place] [address]

## 2016-09-19 NOTE — H&P (Signed)
History and Physical    Jody Schultz VPX:[id number] DOB: 07-14-1965 DOA: 09/19/2016  PCP: Midge Minium, MD  Patient coming from: Home.  Chief Complaint: Chest pressure and left arm pain.  HPI: Jody Schultz is a 51 y.o. female with history of hypothyroidism presently off medications presents to the ER after patient was experiencing chest pressure in the middle of the night. Patient also has had radiating pain to the left arm. Denies any associated shortness of breath productive cough diaphoresis nausea vomiting. Chest pressure lasted only for a few minutes but left arm pain lasted for more than 2 hours. EMS was called and patient was given sublingual nitroglycerin following which left arm discomfort resolved.  ED Course: EKG cardiac markers and chest x-ray were unremarkable. Since patient's pain related sublingual nitroglycerin pain was concerning for acs and has been admitted for further observation. Patient's blood pressure was found to be elevated.  Review of Systems: As per HPI, rest all negative.   Past Medical History:  Diagnosis Date  . Chicken pox   . Depression   . Increased frequency of headaches   . Thyroid disease     Past Surgical History:  Procedure Laterality Date  . CESAREAN SECTION    . CYSTECTOMY       reports that she quit smoking about 28 years ago. Her smoking use included Cigarettes. She has never used smokeless tobacco. She reports that she drinks alcohol. She reports that she does not use drugs.  No Known Allergies  Family History  Problem Relation Age of Onset  . Arthritis Mother   . Stroke Father   . Cancer Maternal Grandmother   . Cancer Paternal Grandmother     Prior to Admission medications   Medication Sig Start Date End Date Taking? Authorizing Provider  Multiple Vitamin (MULTIVITAMIN WITH MINERALS) TABS tablet Take 1 tablet by mouth daily.   Yes [provider]    Physical Exam: Vitals:   09/19/16 0303 09/19/16  0305  BP: (!) 168/107   Pulse: 86   Resp: (!) 24   Temp: 98.3 F (36.8 C)   TempSrc: Oral   SpO2: 99%   Weight:  131.5 kg (290 lb)  Height:  5\' 2"  (1.575 m)      Constitutional: Moderately built and nourished. Vitals:   09/19/16 0303 09/19/16 0305  BP: (!) 168/107   Pulse: 86   Resp: (!) 24   Temp: 98.3 F (36.8 C)   TempSrc: Oral   SpO2: 99%   Weight:  131.5 kg (290 lb)  Height:  5\' 2"  (1.575 m)   Eyes: Anicteric. No pallor. ENMT: No discharge from the ears eyes nose and mouth. Neck: No mass felt. No neck rigidity. Respiratory: No rhonchi or crepitations. Cardiovascular: S1-S2 heard no murmurs appreciated. Abdomen: Soft nontender bowel sounds present. Musculoskeletal: No edema. No joint effusion. Skin: No rash. Skin appears warm. Neurologic: Alert awake oriented to time place and person. Moves all extremities. Psychiatric: Appears normal. Normal affect.   Labs on Admission: I have personally reviewed following labs and imaging studies  CBC:  Recent Labs Lab 09/19/16 0312  WBC 11.8*  HGB 13.9  HCT 43.0  MCV 92.7  PLT 462   Basic Metabolic Panel:  Recent Labs Lab 09/19/16 0312  NA 138  K 4.2  CL 104  CO2 26  GLUCOSE 118*  BUN 16  CREATININE 0.81  CALCIUM 9.1   GFR: Estimated Creatinine Clearance: 106.1 mL/min (by C-G formula based on SCr of  0.81 mg/dL). Liver Function Tests: No results for input(s): AST, ALT, ALKPHOS, BILITOT, PROT, ALBUMIN in the last 168 hours. No results for input(s): LIPASE, AMYLASE in the last 168 hours. No results for input(s): AMMONIA in the last 168 hours. Coagulation Profile: No results for input(s): INR, PROTIME in the last 168 hours. Cardiac Enzymes: No results for input(s): CKTOTAL, CKMB, CKMBINDEX, TROPONINI in the last 168 hours. BNP (last 3 results) No results for input(s): PROBNP in the last 8760 hours. HbA1C: No results for input(s): HGBA1C in the last 72 hours. CBG: No results for input(s): GLUCAP in  the last 168 hours. Lipid Profile: No results for input(s): CHOL, HDL, LDLCALC, TRIG, CHOLHDL, LDLDIRECT in the last 72 hours. Thyroid Function Tests: No results for input(s): TSH, T4TOTAL, FREET4, T3FREE, THYROIDAB in the last 72 hours. Anemia Panel: No results for input(s): VITAMINB12, FOLATE, FERRITIN, TIBC, IRON, RETICCTPCT in the last 72 hours. Urine analysis:    Component Value Date/Time   COLORURINE YELLOW 05/30/2008 2229   APPEARANCEUR CLEAR 05/30/2008 2229   LABSPEC 1.022 05/30/2008 2229   PHURINE 6.5 05/30/2008 2229   GLUCOSEU NEGATIVE 05/30/2008 2229   HGBUR NEGATIVE 05/30/2008 2229   BILIRUBINUR NEGATIVE 05/30/2008 2229   KETONESUR NEGATIVE 05/30/2008 2229   PROTEINUR NEGATIVE 05/30/2008 2229   UROBILINOGEN 0.2 05/30/2008 2229   NITRITE NEGATIVE 05/30/2008 2229   LEUKOCYTESUR  05/30/2008 2229    NEGATIVE MICROSCOPIC NOT DONE ON URINES WITH NEGATIVE PROTEIN, BLOOD, LEUKOCYTES, NITRITE, OR GLUCOSE <1000 mg/dL.   Sepsis Labs: @LABRCNTIP (procalcitonin:4,lacticidven:4) )No results found for this or any previous visit (from the past 240 hour(s)).   Radiological Exams on Admission: Dg Chest 2 View  Result Date: 09/19/2016 CLINICAL DATA:  Dyspnea and hypertension tonight. Left upper extremity pain. EXAM: CHEST  2 VIEW COMPARISON:  None. FINDINGS: The lungs are clear except for mild curvilinear scarring or atelectasis in the left base. The pulmonary vasculature is normal. Heart size is normal. Hilar and mediastinal contours are unremarkable. There is no pleural effusion. IMPRESSION: No active cardiopulmonary disease. Electronically Signed   By: Andreas Newport M.D.   On: 09/19/2016 03:45    EKG: Independently reviewed. Normal sinus rhythm.  Assessment/Plan Principal Problem:   Chest pain Active Problems:   Elevated blood pressure reading    1. Chest pressure with left arm pain improved with sublingual nitroglycerin - we'll cycle cardiac markers to rule out ACS. Check  2-D echo. I have consulted cardiology. 2. Elevated blood pressure - closely for blood pressure trends. 3. History of hypothyroidism off medications now. Check TSH.   DVT prophylaxis: Lovenox. Code Status: Full code.  Family Communication: Discussed with patient.  Disposition Plan: Home.  Consults called: Cardiology.  Admission status: Observation.    Rise Patience MD [facility] Pager [phone].  If 7PM-7AM, please contact night-coverage [email or web address] Password [email or web address]  09/19/2016, 5:47 AM

## 2016-09-19 NOTE — Discharge Summary (Signed)
[facility]  Physician Discharge Summary   Patient ID: Jody Schultz MRN: [id number] DOB/AGE: Dec 14, 1965 51 y.o.  Admit date: 09/19/2016 Discharge date: 09/20/2016  PCP: Fanny Bien, MD  DISCHARGE DIAGNOSES:  Principal Problem:   Chest pain Active Problems:   Hypothyroidism   GERD   Morbid obesity ([place])   Elevated blood pressure reading   RECOMMENDATIONS FOR OUTPATIENT FOLLOW UP: 1. Cardiology to arrange outpatient follow-up 2. Patient already has an appointment with her new primary care provider 3. She will need lipid panel to be checked as outpatient.   DISCHARGE CONDITION: fair  Diet recommendation: Heart healthy  Filed Weights   09/19/16 0305  Weight: 131.5 kg (290 lb)    INITIAL HISTORY: 51 year old Caucasian female with a past medical history of hypothyroidism who has not taken her medications in over a year, presented with complaints of chest tightness with pain in the left arm. Patient without any known history of coronary artery disease. She is obese. Noted to have high blood pressure. She was hospitalized for further management.  Consultations:  Cardiology  Procedures: Transthoracic echocardiogram Study Conclusions  - Left ventricle: The cavity size was normal. Systolic function was   normal. The estimated ejection fraction was in the range of 60%   to 65%. Wall motion was normal; there were no regional wall   motion abnormalities. Left ventricular diastolic function   parameters were normal. - Aortic valve: Poorly visualized. - Right atrium: Poorly visualized. - Pulmonic valve: Poorly visualized. - Pulmonary arteries: Systolic pressure could not be accurately   estimated.   HOSPITAL COURSE:   Chest tightness with left arm pain. Symptoms improved with sublingual nitroglycerin. Initial troponin was normal. Second one is 0.07. The third troponin was normal. EKG did not show any ischemic changes. Cardiology was consulted.  Echocardiogram report as above. No wall motion abnormalities noted. Patient is noted to have elevated blood pressure. Otherwise, she did not have any other symptoms. Cardiology saw the patient and recommends outpatient evaluation. She was ambulated in the hallway without any symptoms. Okay for discharge. Lipid panel to be checked as outpatient.   Elevated blood pressure. Patient does not have any known history of hypertension. However, her blood pressure has been noted to be elevated. She will benefit from antihypertensive treatment. She will be given amlodipine. She knows to check her blood pressure at home.  History of hypothyroidism. She has been off of her medications for over a year. She hasn't seen any physicians in 2 years. TSH noted to be 18. Free T4 is 0.68. Reinitiated Synthroid.  Morbid obesity Body mass index is 53.04 kg/m.   Overall, stable. Okay for discharge home.    PERTINENT LABS:  The results of significant diagnostics from this hospitalization (including imaging, microbiology, ancillary and laboratory) are listed below for reference.     Labs: Basic Metabolic Panel:  Recent Labs Lab 09/19/16 0312 09/19/16 0603  NA 138  --   K 4.2  --   CL 104  --   CO2 26  --   GLUCOSE 118*  --   BUN 16  --   CREATININE 0.81 0.79  CALCIUM 9.1  --    CBC:  Recent Labs Lab 09/19/16 0312 09/19/16 0637  WBC 11.8* 10.1  HGB 13.9 13.1  HCT 43.0 40.1  MCV 92.7 92.2  PLT 336 325   Cardiac Enzymes:  Recent Labs Lab 09/19/16 0603 09/19/16 1047  TROPONINI 0.07* <0.03     IMAGING STUDIES Dg Chest 2 View  Result Date: 09/19/2016 CLINICAL DATA:  Dyspnea and hypertension tonight. Left upper extremity pain. EXAM: CHEST  2 VIEW COMPARISON:  None. FINDINGS: The lungs are clear except for mild curvilinear scarring or atelectasis in the left base. The pulmonary vasculature is normal. Heart size is normal. Hilar and mediastinal contours are unremarkable. There is no  pleural effusion. IMPRESSION: No active cardiopulmonary disease. Electronically Signed   By: Andreas Newport M.D.   On: 09/19/2016 03:45    DISCHARGE EXAMINATION: See progress note from earlier today  DISPOSITION: Home  Discharge Instructions    Call MD for:  difficulty breathing, headache or visual disturbances    Complete by:  As directed    Call MD for:  extreme fatigue    Complete by:  As directed    Call MD for:  persistant dizziness or light-headedness    Complete by:  As directed    Call MD for:  persistant nausea and vomiting    Complete by:  As directed    Call MD for:  severe uncontrolled pain    Complete by:  As directed    Call MD for:  temperature >100.4    Complete by:  As directed    Discharge instructions    Complete by:  As directed    Dr. Allison Quarry office will call to schedule a follow up with him for further testing. Please take the BP medication and check your BP at home. We did not get a chance to check your cholesterol level here. This should be done by your PCP or by Dr. Ellyn Hack. Seek attention immediately if symptoms recur. You may take the nitroglycerin as instructed, but seek attention immediately after. Eat heart healthy diet.  You were cared for by a hospitalist during your hospital stay. If you have any questions about your discharge medications or the care you received while you were in the hospital after you are discharged, you can call the unit and asked to speak with the hospitalist on call if the hospitalist that took care of you is not available. Once you are discharged, your primary care physician will handle any further medical issues. Please note that NO REFILLS for any discharge medications will be authorized once you are discharged, as it is imperative that you return to your primary care physician (or establish a relationship with a primary care physician if you do not have one) for your aftercare needs so that they can reassess your need for  medications and monitor your lab values. If you do not have a primary care physician, you can call [phone] for a physician referral.   Increase activity slowly    Complete by:  As directed       ALLERGIES: No Known Allergies   Discharge Medication List as of 09/19/2016  5:59 PM    START taking these medications   Details  amLODipine (NORVASC) 5 MG tablet Take 1 tablet (5 mg total) by mouth daily., Starting Wed 09/19/2016, Until Thu 09/19/2017, Normal    aspirin EC 81 MG tablet Take 1 tablet (81 mg total) by mouth daily., Starting Wed 09/19/2016, Normal    levothyroxine (SYNTHROID, LEVOTHROID) 50 MCG tablet Take 1 tablet (50 mcg total) by mouth daily before breakfast., Starting Thu 09/20/2016, Normal    nitroGLYCERIN (NITROSTAT) 0.4 MG SL tablet Place 1 tablet (0.4 mg total) under the tongue every 5 (five) minutes as needed for chest pain., Starting Wed 09/19/2016, Normal      CONTINUE these medications which have NOT  CHANGED   Details  Multiple Vitamin (MULTIVITAMIN WITH MINERALS) TABS tablet Take 1 tablet by mouth daily., Historical Med         Follow-up Information    Fanny Bien, MD Follow up.   Specialty:  Family Medicine Contact information: [address] [place] [address] [phone]        Leonie Man, MD Follow up.   Specialty:  Cardiology Why:  his office will call you with appointment and schedule cardiac CT Contact information: [address] [place] [address] [phone]           TOTAL DISCHARGE TIME: 35 mins  [facility] Pager [phone]  09/20/2016, 1:49 PM

## 2016-09-19 NOTE — Discharge Instructions (Signed)

## 2016-10-23 ENCOUNTER — Encounter: Payer: Self-pay | Admitting: Physician Assistant

## 2016-11-01 ENCOUNTER — Ambulatory Visit (HOSPITAL_COMMUNITY): Payer: 59

## 2017-03-05 ENCOUNTER — Other Ambulatory Visit: Payer: Self-pay | Admitting: Family Medicine

## 2017-03-05 DIAGNOSIS — Z1231 Encounter for screening mammogram for malignant neoplasm of breast: Secondary | ICD-10-CM

## 2017-03-26 ENCOUNTER — Ambulatory Visit: Payer: 59

## 2017-04-11 ENCOUNTER — Ambulatory Visit
Admission: RE | Admit: 2017-04-11 | Discharge: 2017-04-11 | Disposition: A | Discharge status: Home / Self Care | Payer: 59 | Source: Ambulatory Visit | Attending: Family Medicine | Admitting: Family Medicine

## 2017-04-11 DIAGNOSIS — Z1231 Encounter for screening mammogram for malignant neoplasm of breast: Secondary | ICD-10-CM

## 2018-09-01 ENCOUNTER — Other Ambulatory Visit: Payer: Self-pay | Admitting: Internal Medicine

## 2018-09-01 ENCOUNTER — Other Ambulatory Visit: Payer: 59

## 2018-09-01 DIAGNOSIS — Z20822 Contact with and (suspected) exposure to covid-19: Secondary | ICD-10-CM

## 2018-09-06 LAB — NOVEL CORONAVIRUS, NAA: SARS-CoV-2, NAA: DETECTED — AB

## 2018-09-08 ENCOUNTER — Telehealth: Payer: Self-pay

## 2018-09-08 NOTE — Telephone Encounter (Signed)
Pt. Called back and given COVID 19 results. Verbalizes understanding. Will quarantine 10 days from beginning of symptoms. States she feels better, "I only coughed for 3-4 days." Will notify her PCP.

## 2018-10-24 ENCOUNTER — Ambulatory Visit
Admission: RE | Admit: 2018-10-24 | Discharge: 2018-10-24 | Disposition: A | Discharge status: Home / Self Care | Payer: No Typology Code available for payment source | Source: Ambulatory Visit | Attending: Family Medicine | Admitting: Family Medicine

## 2018-10-24 ENCOUNTER — Other Ambulatory Visit: Payer: Self-pay | Admitting: Family Medicine

## 2018-10-24 ENCOUNTER — Other Ambulatory Visit: Payer: Self-pay

## 2018-10-24 DIAGNOSIS — M25562 Pain in left knee: Secondary | ICD-10-CM

## 2018-10-24 DIAGNOSIS — M25561 Pain in right knee: Secondary | ICD-10-CM

## 2018-10-24 DIAGNOSIS — M25551 Pain in right hip: Secondary | ICD-10-CM

## 2019-05-21 ENCOUNTER — Other Ambulatory Visit: Payer: Self-pay | Admitting: Family Medicine

## 2019-05-21 DIAGNOSIS — Z1231 Encounter for screening mammogram for malignant neoplasm of breast: Secondary | ICD-10-CM

## 2019-05-25 ENCOUNTER — Encounter: Payer: Self-pay | Admitting: Gastroenterology

## 2019-06-09 ENCOUNTER — Ambulatory Visit
Admission: RE | Admit: 2019-06-09 | Discharge: 2019-06-09 | Disposition: A | Discharge status: Home / Self Care | Payer: No Typology Code available for payment source | Source: Ambulatory Visit | Attending: Family Medicine | Admitting: Family Medicine

## 2019-06-09 ENCOUNTER — Other Ambulatory Visit: Payer: Self-pay

## 2019-06-09 DIAGNOSIS — Z1231 Encounter for screening mammogram for malignant neoplasm of breast: Secondary | ICD-10-CM

## 2019-06-24 ENCOUNTER — Other Ambulatory Visit: Payer: Self-pay

## 2019-06-24 ENCOUNTER — Ambulatory Visit (AMBULATORY_SURGERY_CENTER): Payer: Self-pay | Admitting: *Deleted

## 2019-06-24 VITALS — Temp 97.1°F | Ht 62.0 in | Wt 243.0 lb

## 2019-06-24 DIAGNOSIS — Z01818 Encounter for other preprocedural examination: Secondary | ICD-10-CM

## 2019-06-24 DIAGNOSIS — Z1211 Encounter for screening for malignant neoplasm of colon: Secondary | ICD-10-CM

## 2019-06-24 MED ORDER — NA SULFATE-K SULFATE-MG SULF 17.5-3.13-1.6 GM/177ML PO SOLN: ORAL | 0 refills | Status: DC

## 2019-06-24 NOTE — Progress Notes (Signed)
Patient is here in-person for PV. Patient denies any allergies to eggs or soy. Patient denies any problems with anesthesia/sedation. Patient denies any oxygen use at home. Patient denies taking any diet/weight loss medications or blood thinners. Patient is not being treated for MRSA or C-diff. EMMI education assisgned to the patient for the procedure, this was explained and instructions given to patient. COVID-19 screening test is on 5/12, the pt is aware.  Patient is aware of our care-partner policy and 0000000 safety protocol.   Prep Prescription coupon given to the patient.

## 2019-07-10 ENCOUNTER — Encounter: Payer: No Typology Code available for payment source | Admitting: Gastroenterology

## 2019-09-18 ENCOUNTER — Encounter: Payer: Self-pay | Admitting: Gastroenterology

## 2019-09-18 ENCOUNTER — Ambulatory Visit (AMBULATORY_SURGERY_CENTER): Payer: No Typology Code available for payment source | Admitting: Gastroenterology

## 2019-09-18 ENCOUNTER — Other Ambulatory Visit: Payer: Self-pay

## 2019-09-18 VITALS — BP 128/50 | HR 60 | Temp 96.6°F | Resp 20 | Ht 62.0 in | Wt 243.0 lb

## 2019-09-18 DIAGNOSIS — D120 Benign neoplasm of cecum: Secondary | ICD-10-CM | POA: Diagnosis not present

## 2019-09-18 DIAGNOSIS — Z1211 Encounter for screening for malignant neoplasm of colon: Secondary | ICD-10-CM | POA: Diagnosis present

## 2019-09-18 MED ORDER — SODIUM CHLORIDE 0.9 % IV SOLN: 500.0000 mL | Freq: Once | INTRAVENOUS | Status: DC

## 2019-09-18 NOTE — Op Note (Signed)
[facility] Patient Name: Jody Schultz Procedure Date: 09/18/2019 2:56 PM MRN: [id number] Endoscopist: Milus Banister , MD Age: 54 Referring MD:  Date of Birth: 1966-01-30 Gender: Female Account #: [id number] Procedure:                Colonoscopy Indications:              Screening for colorectal malignant neoplasm Medicines:                Monitored Anesthesia Care Procedure:                Pre-Anesthesia Assessment:                           - Prior to the procedure, a History and Physical                            was performed, and patient medications and                            allergies were reviewed. The patient's tolerance of                            previous anesthesia was also reviewed. The risks                            and benefits of the procedure and the sedation                            options and risks were discussed with the patient.                            All questions were answered, and informed consent                            was obtained. Prior Anticoagulants: The patient has                            taken no previous anticoagulant or antiplatelet                            agents. ASA Grade Assessment: III - A patient with                            severe systemic disease. After reviewing the risks                            and benefits, the patient was deemed in                            satisfactory condition to undergo the procedure.                           After obtaining informed consent, the colonoscope  was passed under direct vision. Throughout the                            procedure, the patient's blood pressure, pulse, and                            oxygen saturations were monitored continuously. The                            Colonoscope was introduced through the anus and                            advanced to the the cecum, identified by                            appendiceal orifice  and ileocecal valve. The                            colonoscopy was performed without difficulty. The                            patient tolerated the procedure well. The quality                            of the bowel preparation was good. The ileocecal                            valve, appendiceal orifice, and rectum were                            photographed. Scope In: 3:04:02 PM Scope Out: 3:18:19 PM Scope Withdrawal Time: 0 hours 10 minutes 57 seconds  Total Procedure Duration: 0 hours 14 minutes 17 seconds  Findings:                 A 7 mm polyp was found in the cecum. The polyp was                            sessile. The polyp was removed with a cold snare.                            Resection and retrieval were complete.                           Multiple small and large-mouthed diverticula were                            found in the left colon.                           The exam was otherwise without abnormality on                            direct and retroflexion views. Complications:  No immediate complications. Estimated blood loss:                            None. Estimated Blood Loss:     Estimated blood loss: none. Impression:               - One 7 mm polyp in the cecum, removed with a cold                            snare. Resected and retrieved.                           - Diverticulosis in the left colon.                           - The examination was otherwise normal on direct                            and retroflexion views. Recommendation:           - Patient has a contact number available for                            emergencies. The signs and symptoms of potential                            delayed complications were discussed with the                            patient. Return to normal activities tomorrow.                            Written discharge instructions were provided to the                            patient.                            - Resume previous diet.                           - Continue present medications.                           - Await pathology results. Milus Banister, MD 09/18/2019 3:21:52 PM This report has been signed electronically.

## 2019-09-18 NOTE — Progress Notes (Signed)
pt tolerated well. VSS. awake and to recovery. Report given to RN.  

## 2019-09-18 NOTE — Patient Instructions (Signed)
YOU HAD AN ENDOSCOPIC PROCEDURE TODAY AT THE [facility]:   Refer to the procedure report that was given to you for any specific questions about what was found during the examination.  If the procedure report does not answer your questions, please call your gastroenterologist to clarify.  If you requested that your care partner not be given the details of your procedure findings, then the procedure report has been included in a sealed envelope for you to review at your convenience later.  YOU SHOULD EXPECT: Some feelings of bloating in the abdomen. Passage of more gas than usual.  Walking can help get rid of the air that was put into your GI tract during the procedure and reduce the bloating. If you had a lower endoscopy (such as a colonoscopy or flexible sigmoidoscopy) you may notice spotting of blood in your stool or on the toilet paper. If you underwent a bowel prep for your procedure, you may not have a normal bowel movement for a few days.  Please Note:  You might notice some irritation and congestion in your nose or some drainage.  This is from the oxygen used during your procedure.  There is no need for concern and it should clear up in a day or so.  SYMPTOMS TO REPORT IMMEDIATELY:   Following lower endoscopy (colonoscopy or flexible sigmoidoscopy):  Excessive amounts of blood in the stool  Significant tenderness or worsening of abdominal pains  Swelling of the abdomen that is new, acute  Fever of 100F or higher  For urgent or emergent issues, a gastroenterologist can be reached at any hour by calling [phone]. Do not use MyChart messaging for urgent concerns.    DIET:  We do recommend a small meal at first, but then you may proceed to your regular diet.  Drink plenty of fluids but you should avoid alcoholic beverages for 24 hours.  ACTIVITY:  You should plan to take it easy for the rest of today and you should NOT DRIVE or use heavy machinery until tomorrow (because  of the sedation medicines used during the test).    FOLLOW UP: Our staff will call the number listed on your records 48-72 hours following your procedure to check on you and address any questions or concerns that you may have regarding the information given to you following your procedure. If we do not reach you, we will leave a message.  We will attempt to reach you two times.  During this call, we will ask if you have developed any symptoms of COVID 19. If you develop any symptoms (ie: fever, flu-like symptoms, shortness of breath, cough etc.) before then, please call [phone].  If you test positive for Covid 19 in the 2 weeks post procedure, please call and report this information to us.    If any biopsies were taken you will be contacted by phone or by letter within the next 1-3 weeks.  Please call us at [phone] if you have not heard about the biopsies in 3 weeks.    SIGNATURES/CONFIDENTIALITY: You and/or your care partner have signed paperwork which will be entered into your electronic medical record.  These signatures attest to the fact that that the information above on your After Visit Summary has been reviewed and is understood.  Full responsibility of the confidentiality of this discharge information lies with you and/or your care-partner. 

## 2019-09-18 NOTE — Progress Notes (Signed)
Called to room to assist during endoscopic procedure.  Patient ID and intended procedure confirmed with present staff. Received instructions for my participation in the procedure from the performing physician.  

## 2019-09-18 NOTE — Progress Notes (Signed)
V/S-  CHECK-IN-MG

## 2019-09-22 ENCOUNTER — Telehealth: Payer: Self-pay

## 2019-09-22 NOTE — Telephone Encounter (Signed)
  Follow up Call-  Call back number 09/18/2019  Post procedure Call Back phone  # [phone]  Permission to leave phone message Yes  Some recent data might be hidden     Patient questions:  Do you have a fever, pain , or abdominal swelling? No. Pain Score  0 *  Have you tolerated food without any problems? Yes.    Have you been able to return to your normal activities? Yes.    Do you have any questions about your discharge instructions: Diet   No. Medications  No. Follow up visit  No.  Do you have questions or concerns about your Care? No.  Actions: * If pain score is 4 or above: No action needed, pain <4.  1. Have you developed a fever since your procedure? no  2.   Have you had an respiratory symptoms (SOB or cough) since your procedure? no  3.   Have you tested positive for COVID 19 since your procedure no  4.   Have you had any family members/close contacts diagnosed with the COVID 19 since your procedure?  no   If yes to any of these questions please route to Joylene John, RN and Erenest Rasher, RN

## 2019-09-23 ENCOUNTER — Encounter: Payer: Self-pay | Admitting: Gastroenterology

## 2020-09-16 IMAGING — CR LEFT KNEE - COMPLETE 4+ VIEW
4 series · 4 of 4 positions shown · non-contrast
Comparison: None.

CLINICAL DATA: Diffuse bilateral knee pain the past month. No known
injury.

EXAM:
LEFT KNEE - COMPLETE 4+ VIEW

[w knee ap left]
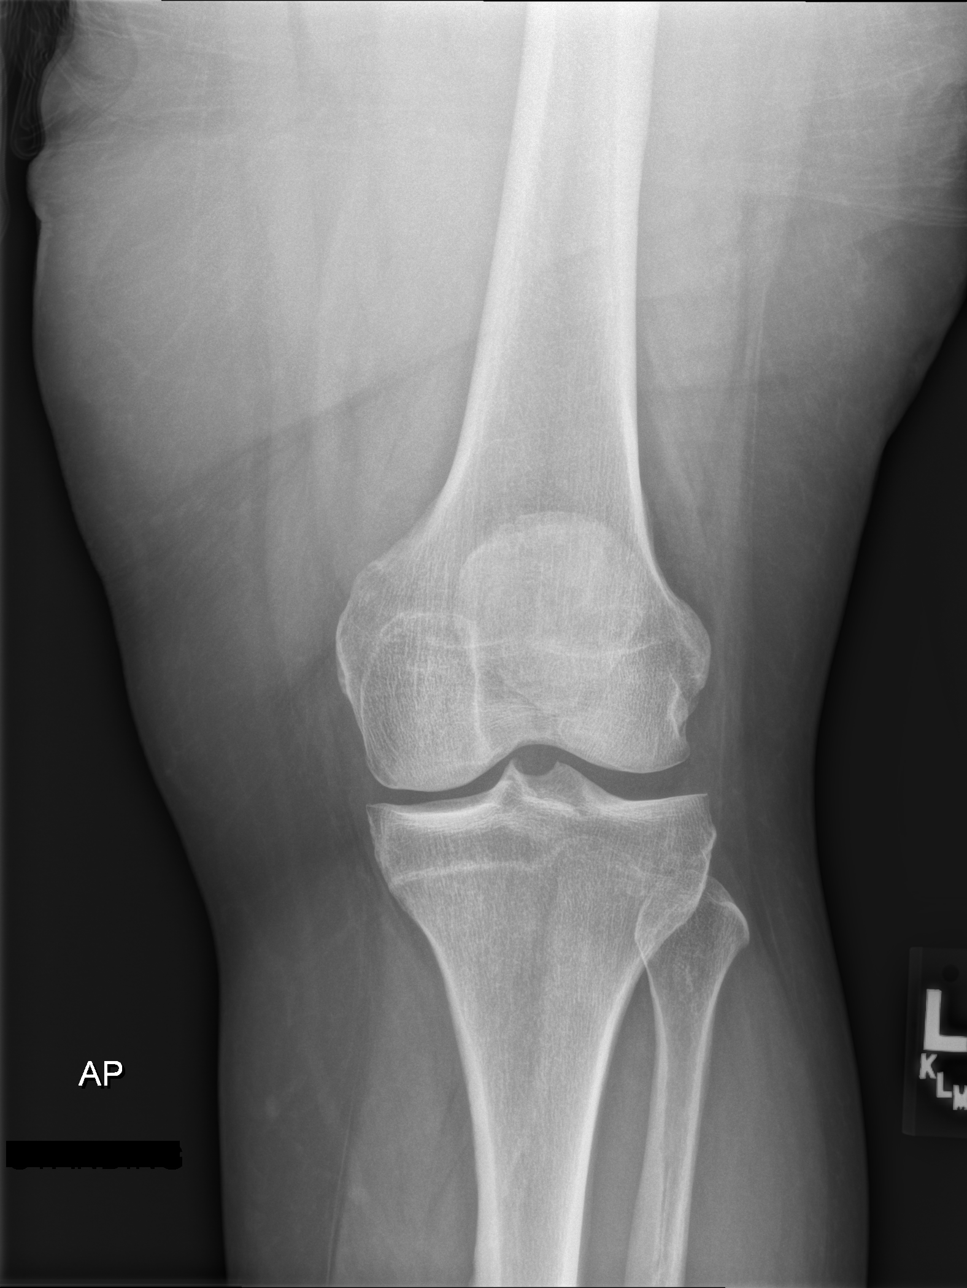

[w knee lat left]
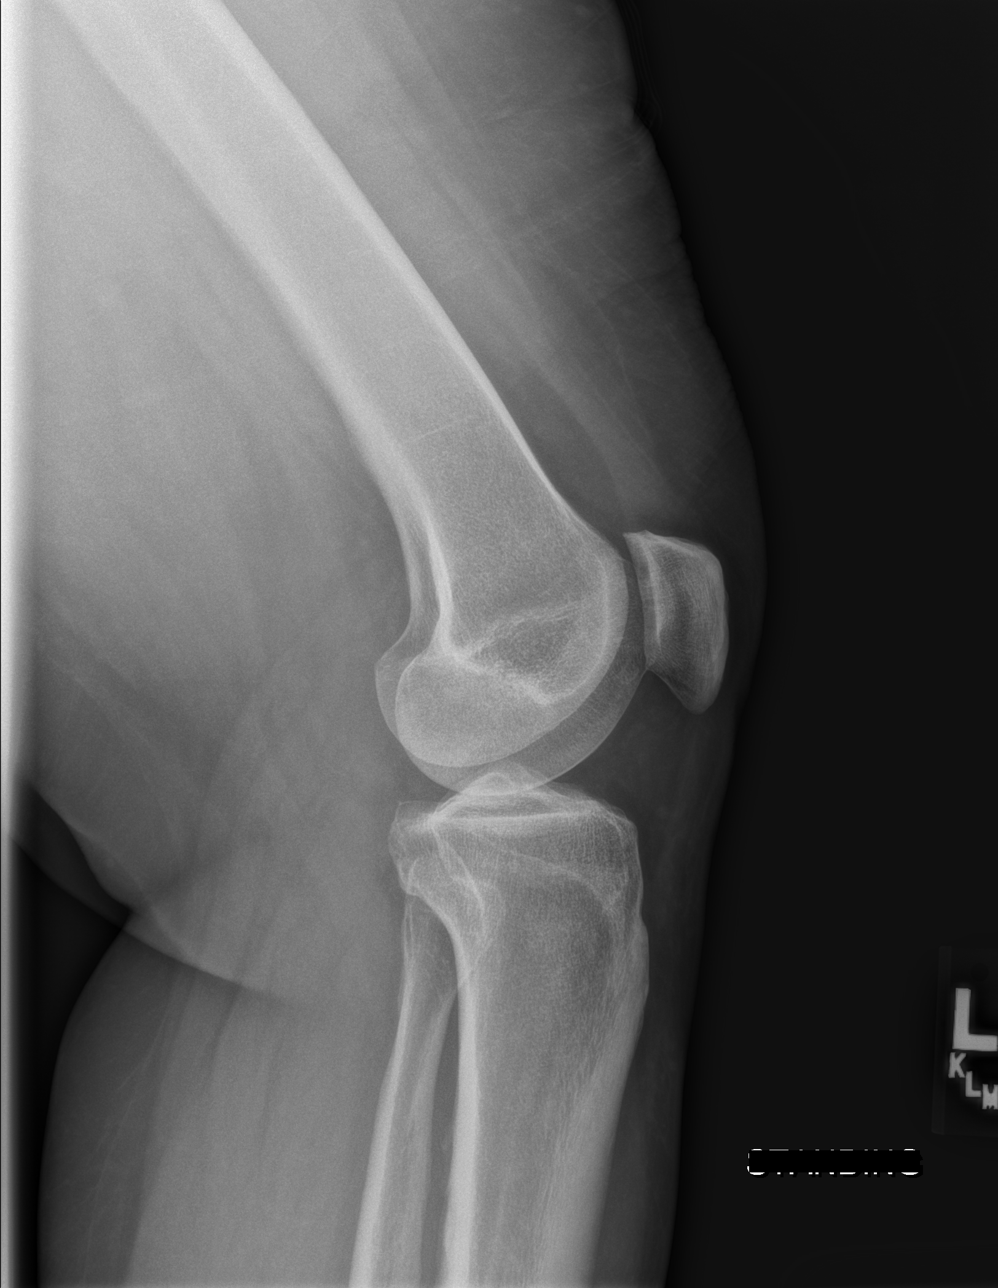

[w knee tunnel pa left]
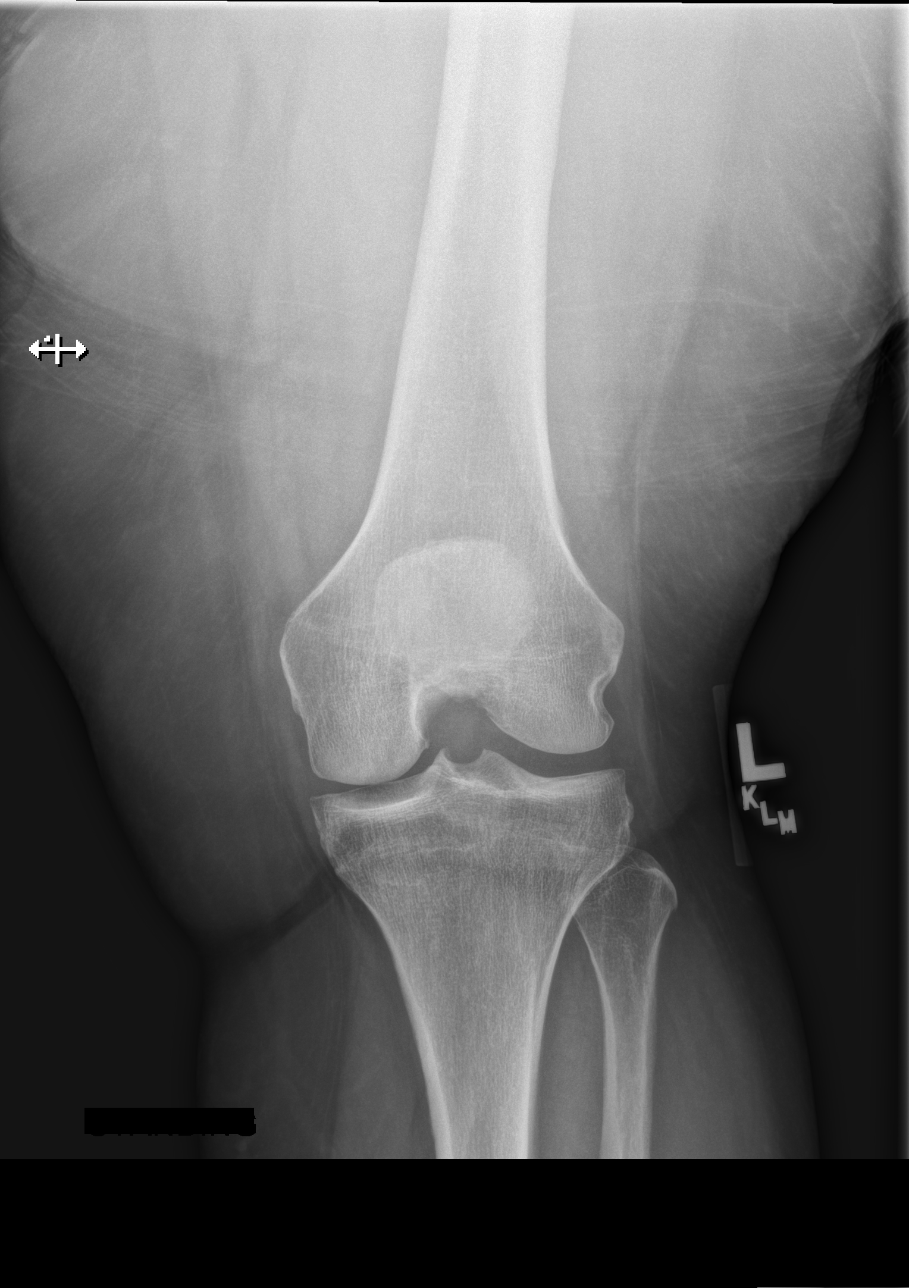

[x knee sunrise left]
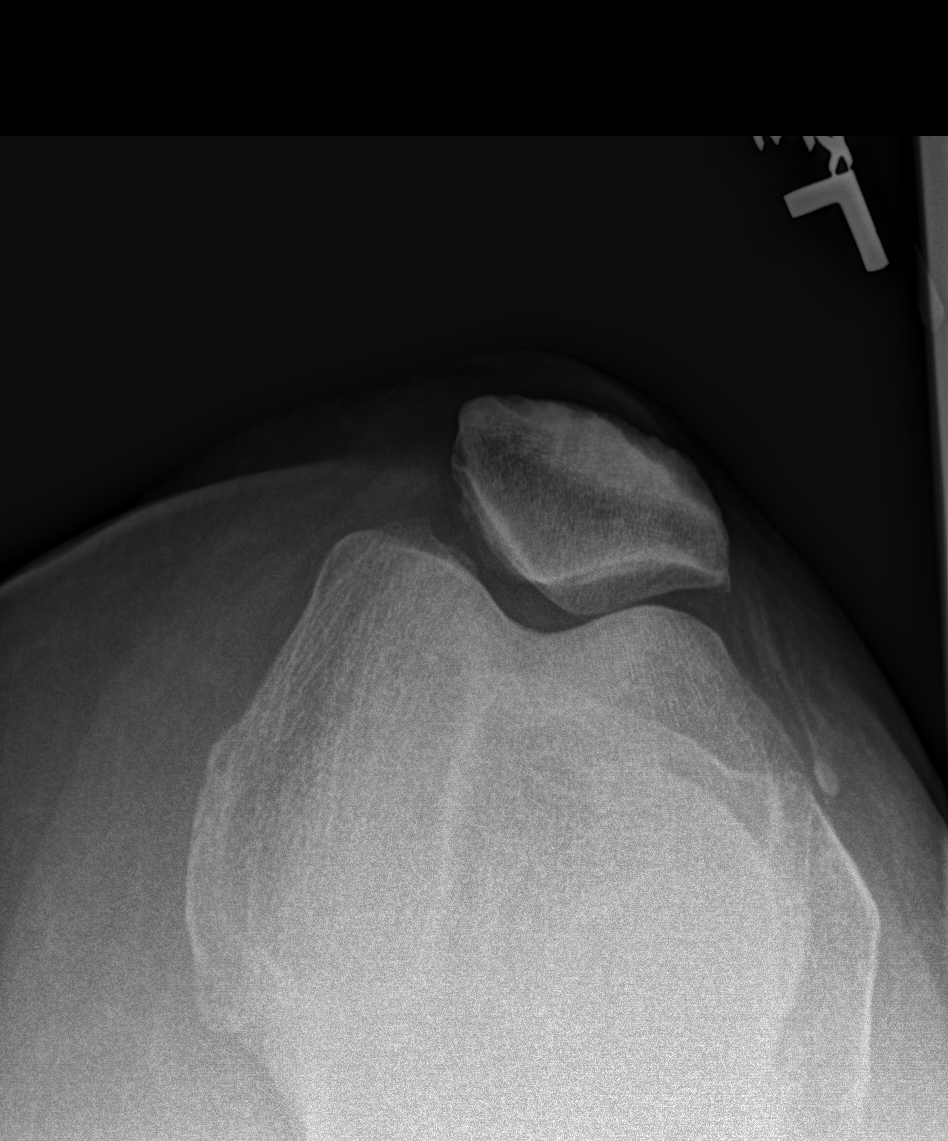

[4 of 4 positions shown; findings below may reference images not displayed]

FINDINGS: No fracture or dislocation. Mild tricompartmental degenerative
change of the knee, worse within the medial compartment with joint
space loss and subchondral sclerosis. There is minimal spurring of
the tibial spines. No evidence of chondrocalcinosis. No joint
effusion. Regional soft tissues appear normal
IMPRESSION: Mild tricompartmental degenerative change of the knee, worse within
medial compartment.

## 2020-09-16 IMAGING — CR RIGHT KNEE - COMPLETE 4+ VIEW
4 series · 4 of 4 positions shown · non-contrast
Comparison: None.

CLINICAL DATA: Bilateral knee pain for the past month. No known
injury.

EXAM:
RIGHT KNEE - COMPLETE 4+ VIEW

[w knee ap right]
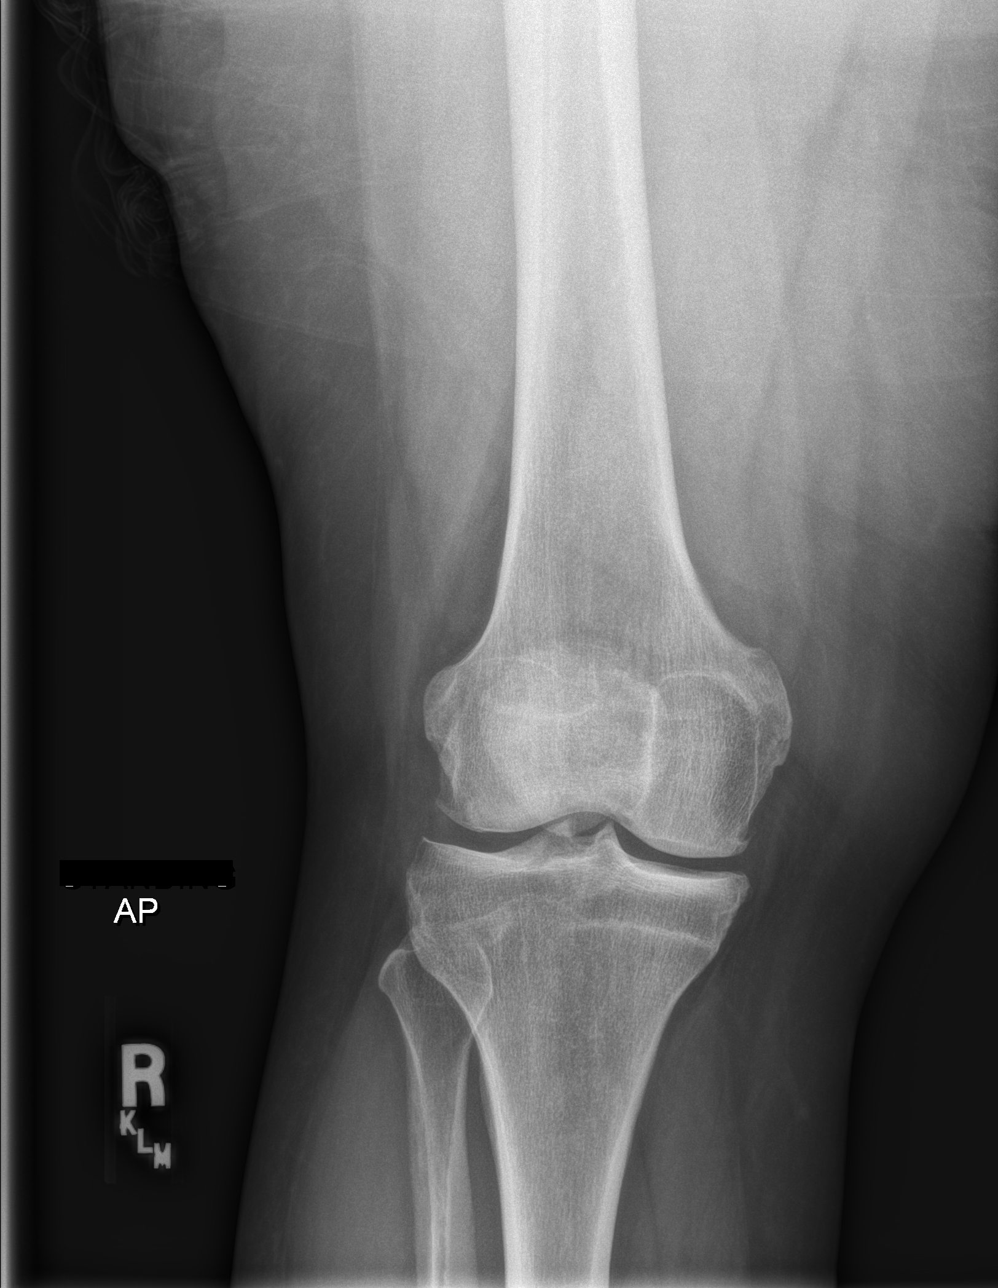

[w knee lat right]
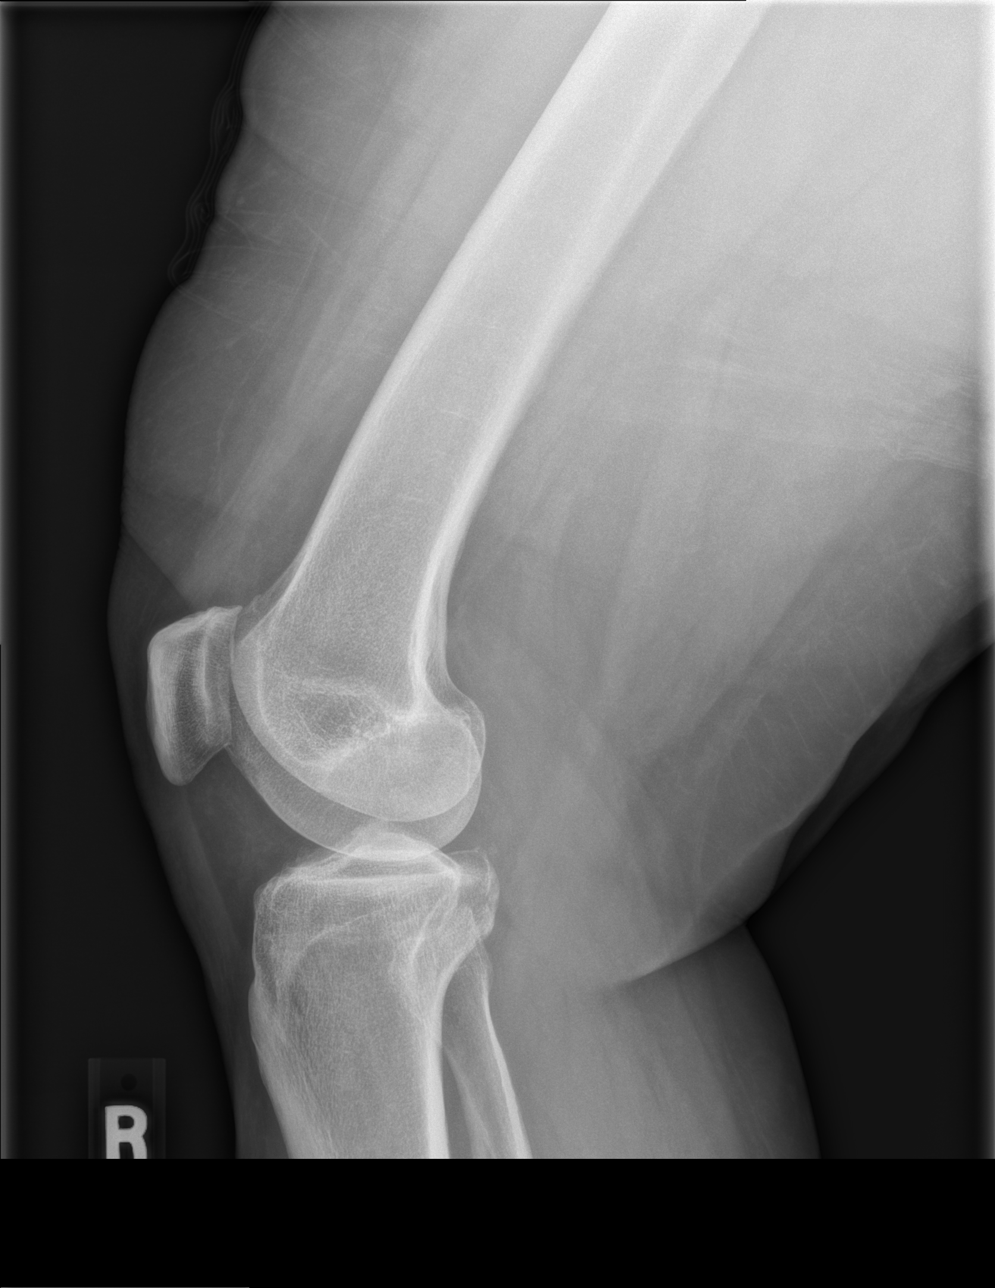

[w knee tunnel pa right]
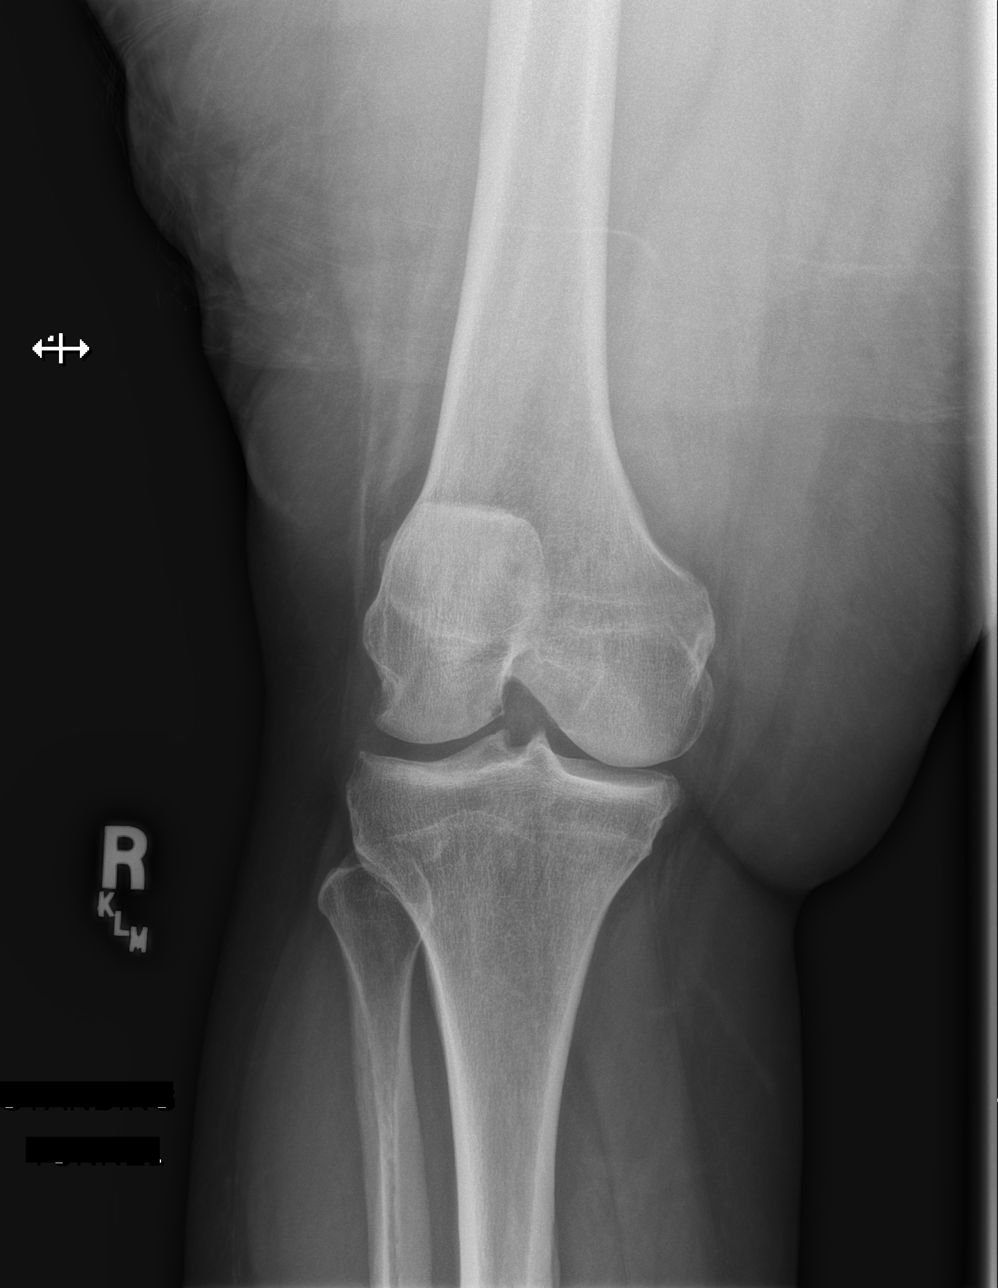

[x knee sunrise right]
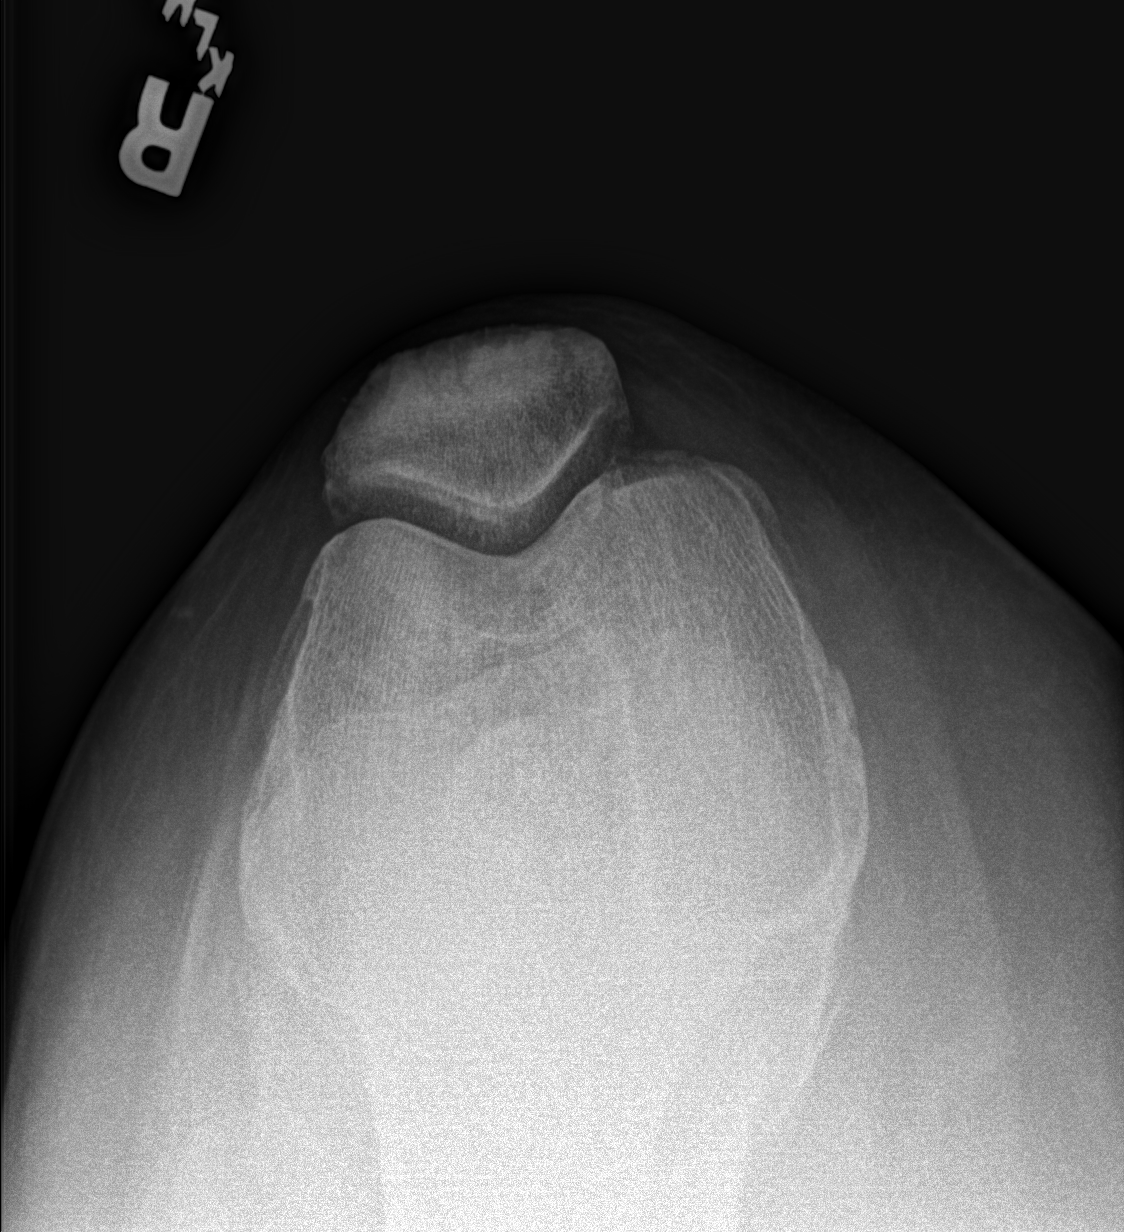

[4 of 4 positions shown; findings below may reference images not displayed]

FINDINGS: Mild to moderate tricompartmental degenerative change of the knee,
worse within the medial compartment with joint space loss,
subchondral sclerosis and osteophytosis. There is minimal spurring
of the tibial spines. No evidence of chondrocalcinosis. No joint
effusion. Regional soft tissues appear normal.
IMPRESSION: Mild-to-moderate tricompartmental degenerative change of the knee,
worse within the medial compartment.

## 2020-11-18 ENCOUNTER — Emergency Department (HOSPITAL_BASED_OUTPATIENT_CLINIC_OR_DEPARTMENT_OTHER): Payer: No Typology Code available for payment source | Admitting: Radiology

## 2020-11-18 ENCOUNTER — Other Ambulatory Visit: Payer: Self-pay

## 2020-11-18 ENCOUNTER — Emergency Department (HOSPITAL_BASED_OUTPATIENT_CLINIC_OR_DEPARTMENT_OTHER)
Admission: EM | Admit: 2020-11-18 | Discharge: 2020-11-18 | Disposition: A | Discharge status: Home / Self Care | Payer: No Typology Code available for payment source | Attending: Emergency Medicine | Admitting: Emergency Medicine

## 2020-11-18 ENCOUNTER — Encounter (HOSPITAL_BASED_OUTPATIENT_CLINIC_OR_DEPARTMENT_OTHER): Payer: Self-pay | Admitting: *Deleted

## 2020-11-18 DIAGNOSIS — R110 Nausea: Secondary | ICD-10-CM | POA: Insufficient documentation

## 2020-11-18 DIAGNOSIS — Z79899 Other long term (current) drug therapy: Secondary | ICD-10-CM | POA: Insufficient documentation

## 2020-11-18 DIAGNOSIS — E039 Hypothyroidism, unspecified: Secondary | ICD-10-CM | POA: Diagnosis not present

## 2020-11-18 DIAGNOSIS — M79602 Pain in left arm: Secondary | ICD-10-CM | POA: Insufficient documentation

## 2020-11-18 DIAGNOSIS — I10 Essential (primary) hypertension: Secondary | ICD-10-CM | POA: Diagnosis not present

## 2020-11-18 DIAGNOSIS — M25512 Pain in left shoulder: Secondary | ICD-10-CM | POA: Diagnosis not present

## 2020-11-18 DIAGNOSIS — R600 Localized edema: Secondary | ICD-10-CM | POA: Insufficient documentation

## 2020-11-18 DIAGNOSIS — Z20822 Contact with and (suspected) exposure to covid-19: Secondary | ICD-10-CM | POA: Diagnosis not present

## 2020-11-18 DIAGNOSIS — R6884 Jaw pain: Secondary | ICD-10-CM | POA: Insufficient documentation

## 2020-11-18 DIAGNOSIS — R072 Precordial pain: Secondary | ICD-10-CM | POA: Insufficient documentation

## 2020-11-18 DIAGNOSIS — M542 Cervicalgia: Secondary | ICD-10-CM | POA: Insufficient documentation

## 2020-11-18 DIAGNOSIS — R0789 Other chest pain: Secondary | ICD-10-CM

## 2020-11-18 DIAGNOSIS — Z87891 Personal history of nicotine dependence: Secondary | ICD-10-CM | POA: Insufficient documentation

## 2020-11-18 LAB — URINALYSIS, ROUTINE W REFLEX MICROSCOPIC
Bilirubin Urine: NEGATIVE
Glucose, UA: NEGATIVE mg/dL
Hgb urine dipstick: NEGATIVE
Ketones, ur: NEGATIVE mg/dL
Nitrite: NEGATIVE
Protein, ur: NEGATIVE mg/dL
Specific Gravity, Urine: 1.01 (ref 1.005–1.030)
pH: 6 (ref 5.0–8.0)

## 2020-11-18 LAB — COMPREHENSIVE METABOLIC PANEL
ALT: 18 U/L (ref 0–44)
AST: 15 U/L (ref 15–41)
Albumin: 4.2 g/dL (ref 3.5–5.0)
Alkaline Phosphatase: 74 U/L (ref 38–126)
Anion gap: 9 (ref 5–15)
BUN: 16 mg/dL (ref 6–20)
CO2: 28 mmol/L (ref 22–32)
Calcium: 9.5 mg/dL (ref 8.9–10.3)
Chloride: 104 mmol/L (ref 98–111)
Creatinine, Ser: 0.72 mg/dL (ref 0.44–1.00)
GFR, Estimated: >60 mL/min (ref >60)
Glucose, Bld: 119 mg/dL — ABNORMAL HIGH (ref 70–99)
Potassium: 4.1 mmol/L (ref 3.5–5.1)
Sodium: 141 mmol/L (ref 135–145)
Total Bilirubin: 0.6 mg/dL (ref 0.3–1.2)
Total Protein: 7.4 g/dL (ref 6.5–8.1)

## 2020-11-18 LAB — TROPONIN I (HIGH SENSITIVITY): Troponin I (High Sensitivity): 2 ng/L (ref <18)

## 2020-11-18 LAB — RESP PANEL BY RT-PCR (FLU A&B, COVID) ARPGX2
Influenza A by PCR: NEGATIVE
Influenza B by PCR: NEGATIVE
SARS Coronavirus 2 by RT PCR: NEGATIVE

## 2020-11-18 LAB — CBC WITH DIFFERENTIAL/PLATELET
Abs Immature Granulocytes: 0.04 10*3/uL (ref 0.00–0.07)
Basophils Absolute: 0.1 10*3/uL (ref 0.0–0.1)
Basophils Relative: 1 %
Eosinophils Absolute: 0.2 10*3/uL (ref 0.0–0.5)
Eosinophils Relative: 3 %
HCT: 41.1 % (ref 36.0–46.0)
Hemoglobin: 13.6 g/dL (ref 12.0–15.0)
Immature Granulocytes: 1 %
Lymphocytes Relative: 23 %
Lymphs Abs: 2.1 10*3/uL (ref 0.7–4.0)
MCH: 31.3 pg (ref 26.0–34.0)
MCHC: 33.1 g/dL (ref 30.0–36.0)
MCV: 94.7 fL (ref 80.0–100.0)
Monocytes Absolute: 0.6 10*3/uL (ref 0.1–1.0)
Monocytes Relative: 7 %
Neutro Abs: 5.8 10*3/uL (ref 1.7–7.7)
Neutrophils Relative %: 65 %
Platelets: 324 10*3/uL (ref 150–400)
RBC: 4.34 MIL/uL (ref 3.87–5.11)
RDW: 12.7 % (ref 11.5–15.5)
WBC: 8.9 10*3/uL (ref 4.0–10.5)
nRBC: 0 % (ref 0.0–0.2)

## 2020-11-18 LAB — D-DIMER, QUANTITATIVE: D-Dimer, Quant: 0.33 ug/mL-FEU (ref 0.00–0.50)

## 2020-11-18 LAB — BRAIN NATRIURETIC PEPTIDE: B Natriuretic Peptide: 32 pg/mL (ref 0.0–100.0)

## 2020-11-18 LAB — LIPASE, BLOOD: Lipase: 19 U/L (ref 11–51)

## 2020-11-18 MED ORDER — KETOROLAC TROMETHAMINE 15 MG/ML IJ SOLN
15.0000 mg | Freq: Once | INTRAMUSCULAR | Status: AC
  Administered 2020-11-18: 15 mg via INTRAVENOUS
  Filled 2020-11-18: qty 1

## 2020-11-18 MED ORDER — SODIUM CHLORIDE 0.9 % IV BOLUS
1000.0000 mL | Freq: Once | INTRAVENOUS | Status: AC
  Administered 2020-11-18: 1000 mL via INTRAVENOUS

## 2020-11-18 MED ORDER — ASPIRIN 81 MG PO CHEW
324.0000 mg | CHEWABLE_TABLET | Freq: Once | ORAL | Status: AC
  Administered 2020-11-18: 324 mg via ORAL
  Filled 2020-11-18: qty 4

## 2020-11-18 MED ORDER — ONDANSETRON HCL 4 MG/2ML IJ SOLN
4.0000 mg | Freq: Once | INTRAMUSCULAR | Status: DC
  Filled 2020-11-18: qty 2

## 2020-11-18 MED ORDER — FAMOTIDINE IN NACL 20-0.9 MG/50ML-% IV SOLN
20.0000 mg | Freq: Once | INTRAVENOUS | Status: AC
  Administered 2020-11-18: 20 mg via INTRAVENOUS
  Filled 2020-11-18: qty 50

## 2020-11-18 NOTE — Discharge Instructions (Addendum)

## 2020-11-18 NOTE — ED Triage Notes (Signed)
Intermittent chest pressure for 2 weeks.  Patient stated that she has been sick with coughing and congestion.  Tested for Covid - over 2 weeks ago.  Chest pressure has been consistent since last night and a wave of nausea and right shoulder pain.

## 2020-11-18 NOTE — ED Provider Notes (Signed)
[facility]PT Provider Note   CSN: [id number] Arrival date & time: 11/18/20  9892     History Chief Complaint  Patient presents with   Chest Pain    Jody Schultz is a 55 y.o. female.  This is a 55 y.o. female with significant medical history as below, including hyperlipidemia, hypertension, brother with MI around age 58 who presents to the ED with complaint of chest pain.  Patient reports chest pain has been intermittent over the past week.  She recently had a upper respiratory infection, negative COVID test.  Respiratory symptoms and URI symptoms had resolved and she was still having intermittent chest discomfort, she feels that the chest discomfort has gotten worse.  Began while at rest.  Not worse with exertion, pain is worse when lying flat or with direct palpation.  Localized to the substernal region, radiates to her left neck and left arm.  Described as tightness, pressure, sharp.  Associated mild nausea.  No vomiting.  No dyspnea.  No lightheadedness or headaches.  No numbness or vision changes.  She does not follow with cardiology.  No recent medication or dietary changes.  No recent travel or sick contacts.  No history of DVT or PE.   The history is provided by the patient and the spouse. No language interpreter was used.  Chest Pain Pain location:  Substernal area Pain quality: pressure, radiating and tightness   Pain radiates to:  L jaw and L shoulder Pain severity:  Mild Onset quality:  Sudden Chronicity:  New Associated symptoms: nausea   Associated symptoms: no abdominal pain, no back pain, no cough, no dysphagia, no fever, no headache, no palpitations and no shortness of breath       Past Medical History:  Diagnosis Date   Allergy    Chicken pox    Depression    GERD (gastroesophageal reflux disease)    Hyperlipidemia    Hypertension    Increased frequency of headaches    Thyroid disease     Patient Active Problem List    Diagnosis Date Noted   Chest pain 09/19/2016   Screening for malignant neoplasm of the cervix 08/25/2012   Elevated blood pressure reading 08/25/2012   Morbid obesity ([place]) 07/09/2012   Routine general medical examination at a health care facility 07/09/2012   Depression with anxiety 07/09/2012   Hypothyroidism 06/22/2008   GERD 06/08/2008   HEADACHE 06/08/2008   URINARY INCONTINENCE 06/08/2008    Past Surgical History:  Procedure Laterality Date   CESAREAN SECTION     CYSTECTOMY       OB History     Gravida  3   Para  2   Term      Preterm      AB  1   Living         SAB      IAB      Ectopic      Multiple      Live Births              Family History  Problem Relation Age of Onset   Arthritis Mother    Stroke Father    Esophageal cancer Father    Cancer Maternal Grandmother    Cancer Paternal Grandmother    Colon cancer Neg Hx    Colon polyps Neg Hx    Stomach cancer Neg Hx    Rectal cancer Neg Hx     Social History   Tobacco Use  Smoking status: Former    Types: Cigarettes    Quit date: 01/27/1988    Years since quitting: 32.8   Smokeless tobacco: Never  Vaping Use   Vaping Use: Never used  Substance Use Topics   Alcohol use: Yes    Alcohol/week: 1.0 standard drink    Types: 1 Standard drinks or equivalent per week    Comment: social   Drug use: No    Home Medications Prior to Admission medications   Medication Sig Start Date End Date Taking? Authorizing Provider  amLODipine (NORVASC) 5 MG tablet Take 1 tablet (5 mg total) by mouth daily. 09/19/16 06/24/19  Bonnielee Haff, MD  amLODipine (NORVASC) 5 MG tablet Take 5 mg by mouth daily.    [provider]  atorvastatin (LIPITOR) 10 MG tablet atorvastatin 10 mg tablet    [provider]  diclofenac Sodium (VOLTAREN) 1 % GEL diclofenac 1 % topical gel  APP 4 GRAMS EXT AA QID    [provider]  levothyroxine (SYNTHROID) 125 MCG tablet Take 125 mcg by  mouth daily. 05/23/19   [provider]  losartan (COZAAR) 100 MG tablet Take 100 mg by mouth daily. 05/23/19   [provider]  Multiple Vitamin (MULTIVITAMIN WITH MINERALS) TABS tablet Take 1 tablet by mouth daily.    [provider]  nitroGLYCERIN (NITROSTAT) 0.4 MG SL tablet Place 1 tablet (0.4 mg total) under the tongue every 5 (five) minutes as needed for chest pain. Patient not taking: Reported on 06/24/2019 09/19/16   Bonnielee Haff, MD  UNABLE TO FIND Med Name: thyroid support herbal pill daily    [provider]  VITAMIN D PO Take by mouth.    [provider]    Allergies    Patient has no known allergies.  Review of Systems   Review of Systems  Constitutional:  Negative for activity change and fever.  HENT:  Negative for facial swelling and trouble swallowing.   Eyes:  Negative for discharge and redness.  Respiratory:  Positive for chest tightness. Negative for cough and shortness of breath.   Cardiovascular:  Positive for chest pain. Negative for palpitations.  Gastrointestinal:  Positive for nausea. Negative for abdominal pain.  Genitourinary:  Negative for dysuria and flank pain.  Musculoskeletal:  Negative for back pain and gait problem.  Skin:  Negative for pallor and rash.  Neurological:  Negative for syncope and headaches.   Physical Exam Updated Vital Signs BP 128/69   Pulse 63   Temp 98.3 F (36.8 C) (Oral)   Resp 20   Ht 5\' 3"  (1.6 m)   Wt 127 kg   SpO2 98%   BMI 49.60 kg/m   Physical Exam Vitals and nursing note reviewed.  Constitutional:      General: She is not in acute distress.    Appearance: Normal appearance. She is well-developed. She is obese.  HENT:     Head: Normocephalic and atraumatic.     Right Ear: External ear normal.     Left Ear: External ear normal.     Nose: Nose normal.     Mouth/Throat:     Mouth: Mucous membranes are moist.  Eyes:     General: No scleral icterus.       Right eye:  No discharge.        Left eye: No discharge.  Neck:     Thyroid: No thyromegaly.  Cardiovascular:     Rate and Rhythm: Normal rate and regular rhythm.  Pulses: Normal pulses.     Heart sounds: Normal heart sounds. Heart sounds not distant. No murmur heard. Pulmonary:     Effort: Pulmonary effort is normal. No tachypnea, accessory muscle usage or respiratory distress.     Breath sounds: Normal breath sounds. No decreased breath sounds.  Abdominal:     General: Abdomen is flat.     Tenderness: There is no abdominal tenderness.  Musculoskeletal:        General: Normal range of motion.     Cervical back: Normal range of motion.     Right lower leg: Edema (trace) present.     Left lower leg: Edema (trace) present.  Skin:    General: Skin is warm and dry.     Capillary Refill: Capillary refill takes less than 2 seconds.  Neurological:     Mental Status: She is alert.  Psychiatric:        Mood and Affect: Mood normal.        Behavior: Behavior normal.    ED Results / Procedures / Treatments   Labs (all labs ordered are listed, but only abnormal results are displayed) Labs Reviewed  COMPREHENSIVE METABOLIC PANEL - Abnormal; Notable for the following components:      Result Value   Glucose, Bld 119 (*)    All other components within normal limits  URINALYSIS, ROUTINE W REFLEX MICROSCOPIC - Abnormal; Notable for the following components:   Leukocytes,Ua SMALL (*)    Bacteria, UA RARE (*)    All other components within normal limits  RESP PANEL BY RT-PCR (FLU A&B, COVID) ARPGX2  CBC WITH DIFFERENTIAL/PLATELET  BRAIN NATRIURETIC PEPTIDE  D-DIMER, QUANTITATIVE  LIPASE, BLOOD  TROPONIN I (HIGH SENSITIVITY)  TROPONIN I (HIGH SENSITIVITY)    EKG EKG Interpretation  Date/Time:  Friday November 18 2020 09:08:42 EDT Ventricular Rate:  79 PR Interval:  146 QRS Duration: 92 QT Interval:  382 QTC Calculation: 438 R Axis:   29 Text Interpretation: Sinus rhythm Similar to  prior tracing Confirmed by Wynona Dove ([id number]) on 11/18/2020 9:48:11 AM  Radiology DG Chest 2 View  Result Date: 11/18/2020 CLINICAL DATA:  Chest pain EXAM: CHEST - 2 VIEW COMPARISON:  09/19/2016 FINDINGS: The heart size and mediastinal contours are within normal limits. Both lungs are clear. The visualized skeletal structures are unremarkable. IMPRESSION: No acute abnormality of the lungs. Electronically Signed   By: Eddie Candle M.D.   On: 11/18/2020 10:27    Procedures Procedures   Medications Ordered in ED Medications  sodium chloride 0.9 % bolus 1,000 mL (1,000 mLs Intravenous New Bag/Given 11/18/20 0942)  aspirin chewable tablet 324 mg (324 mg Oral Given 11/18/20 1051)  ketorolac (TORADOL) 15 MG/ML injection 15 mg (15 mg Intravenous Given 11/18/20 1206)  famotidine (PEPCID) IVPB 20 mg premix (0 mg Intravenous Stopped 11/18/20 1240)    ED Course  I have reviewed the triage vital signs and the nursing notes.  Pertinent labs & imaging results that were available during my care of the patient were reviewed by me and considered in my medical decision making (see chart for details).    MDM Rules/Calculators/A&P                         This patient complains of chest pain; this involves an extensive number of treatment options and is a complaint that carries with it a high risk of complications and morbidity.  Vital signs reviewed and are stable.  She is  currently having chest pain.  Serious etiologies considered.   I ordered, reviewed and interpreted labs, these overall were negative  I ordered imaging studies which included CXR and I independently visualized and interpreted imaging which showed no acute abnormalities  Additional history obtained from spouse  Previous records obtained and reviewed   Wells score is low, PERC rule does not apply. Obtain D-dimer which was negative. PE is unlikely.  The patient's chest pain is not suggestive of pulmonary embolus, cardiac ischemia, aortic  dissection, pericarditis, myocarditis, pulmonary embolism, pneumothorax, pneumonia, Zoster, or esophageal perforation, or other serious etiology.  Historically not abrupt in onset, tearing or ripping, pulses symmetric. EKG nonspecific for ischemia/infarction. No dysrhythmias, brugada, WPW, prolonged QT noted.  Troponin negative. CXR reviewed. Labs without demonstration of acute pathology unless otherwise noted above. Low HEART Score: 0-3 points (0.9-1.7% risk of MACE). Given the extremely low risk of these diagnoses further testing and evaluation for these possibilities does not appear to be indicated at this time. Patient in no distress and overall condition improved here in the ED. Detailed discussions were had with the patient regarding current findings, and need for close f/u with PCP or on call doctor. The patient has been instructed to return immediately if the symptoms worsen in any way for re-evaluation. Patient verbalized understanding and is in agreement with current care plan. All questions answered prior to discharge.  Advised pt to f/u with cardiology in the next few days for ongoing evaluation regarding symptoms.   Final Clinical Impression(s) / ED Diagnoses Final diagnoses:  Atypical chest pain    Rx / DC Orders ED Discharge Orders     None        Jeanell Sparrow, DO 11/18/20 1931

## 2022-05-03 ENCOUNTER — Emergency Department (HOSPITAL_BASED_OUTPATIENT_CLINIC_OR_DEPARTMENT_OTHER): Payer: No Typology Code available for payment source | Admitting: Radiology

## 2022-05-03 ENCOUNTER — Other Ambulatory Visit: Payer: Self-pay

## 2022-05-03 ENCOUNTER — Emergency Department (HOSPITAL_BASED_OUTPATIENT_CLINIC_OR_DEPARTMENT_OTHER)
Admission: EM | Admit: 2022-05-03 | Discharge: 2022-05-03 | Disposition: A | Discharge status: Home / Self Care | Payer: No Typology Code available for payment source | Attending: Emergency Medicine | Admitting: Emergency Medicine

## 2022-05-03 ENCOUNTER — Encounter (HOSPITAL_BASED_OUTPATIENT_CLINIC_OR_DEPARTMENT_OTHER): Payer: Self-pay

## 2022-05-03 DIAGNOSIS — Z20822 Contact with and (suspected) exposure to covid-19: Secondary | ICD-10-CM | POA: Diagnosis not present

## 2022-05-03 DIAGNOSIS — B349 Viral infection, unspecified: Secondary | ICD-10-CM | POA: Diagnosis not present

## 2022-05-03 DIAGNOSIS — E860 Dehydration: Secondary | ICD-10-CM

## 2022-05-03 DIAGNOSIS — I10 Essential (primary) hypertension: Secondary | ICD-10-CM | POA: Insufficient documentation

## 2022-05-03 DIAGNOSIS — Z79899 Other long term (current) drug therapy: Secondary | ICD-10-CM | POA: Insufficient documentation

## 2022-05-03 DIAGNOSIS — R531 Weakness: Secondary | ICD-10-CM | POA: Diagnosis present

## 2022-05-03 DIAGNOSIS — R197 Diarrhea, unspecified: Secondary | ICD-10-CM

## 2022-05-03 LAB — URINALYSIS, ROUTINE W REFLEX MICROSCOPIC
Bacteria, UA: NONE SEEN
Glucose, UA: NEGATIVE mg/dL
Hgb urine dipstick: NEGATIVE
Ketones, ur: >80 mg/dL — AB
Nitrite: NEGATIVE
Protein, ur: 30 mg/dL — AB
Specific Gravity, Urine: 1.026 (ref 1.005–1.030)
pH: 6 (ref 5.0–8.0)

## 2022-05-03 LAB — CBC WITH DIFFERENTIAL/PLATELET
Abs Immature Granulocytes: 0.08 10*3/uL — ABNORMAL HIGH (ref 0.00–0.07)
Basophils Absolute: 0.1 10*3/uL (ref 0.0–0.1)
Basophils Relative: 1 %
Eosinophils Absolute: 0 10*3/uL (ref 0.0–0.5)
Eosinophils Relative: 1 %
HCT: 39.7 % (ref 36.0–46.0)
Hemoglobin: 13.5 g/dL (ref 12.0–15.0)
Immature Granulocytes: 2 %
Lymphocytes Relative: 27 %
Lymphs Abs: 1.4 10*3/uL (ref 0.7–4.0)
MCH: 31.2 pg (ref 26.0–34.0)
MCHC: 34 g/dL (ref 30.0–36.0)
MCV: 91.7 fL (ref 80.0–100.0)
Monocytes Absolute: 0.7 10*3/uL (ref 0.1–1.0)
Monocytes Relative: 14 %
Neutro Abs: 2.9 10*3/uL (ref 1.7–7.7)
Neutrophils Relative %: 55 %
Platelets: 335 10*3/uL (ref 150–400)
RBC: 4.33 MIL/uL (ref 3.87–5.11)
RDW: 12.1 % (ref 11.5–15.5)
WBC Morphology: INCREASED
WBC: 5.2 10*3/uL (ref 4.0–10.5)
nRBC: 0.4 % — ABNORMAL HIGH (ref 0.0–0.2)

## 2022-05-03 LAB — COMPREHENSIVE METABOLIC PANEL
ALT: 13 U/L (ref 0–44)
AST: 12 U/L — ABNORMAL LOW (ref 15–41)
Albumin: 3.7 g/dL (ref 3.5–5.0)
Alkaline Phosphatase: 48 U/L (ref 38–126)
Anion gap: 12 (ref 5–15)
BUN: 11 mg/dL (ref 6–20)
CO2: 23 mmol/L (ref 22–32)
Calcium: 8.9 mg/dL (ref 8.9–10.3)
Chloride: 99 mmol/L (ref 98–111)
Creatinine, Ser: 0.92 mg/dL (ref 0.44–1.00)
GFR, Estimated: >60 mL/min (ref >60)
Glucose, Bld: 81 mg/dL (ref 70–99)
Potassium: 3.8 mmol/L (ref 3.5–5.1)
Sodium: 134 mmol/L — ABNORMAL LOW (ref 135–145)
Total Bilirubin: 1.3 mg/dL — ABNORMAL HIGH (ref 0.3–1.2)
Total Protein: 7.3 g/dL (ref 6.5–8.1)

## 2022-05-03 LAB — RESP PANEL BY RT-PCR (RSV, FLU A&B, COVID)  RVPGX2
Influenza A by PCR: NEGATIVE
Influenza B by PCR: NEGATIVE
Resp Syncytial Virus by PCR: NEGATIVE
SARS Coronavirus 2 by RT PCR: NEGATIVE

## 2022-05-03 LAB — LIPASE, BLOOD: Lipase: <10 U/L — ABNORMAL LOW (ref 11–51)

## 2022-05-03 MED ORDER — SODIUM CHLORIDE 0.9 % IV BOLUS
1000.0000 mL | Freq: Once | INTRAVENOUS | Status: AC
  Administered 2022-05-03: 1000 mL via INTRAVENOUS

## 2022-05-03 NOTE — ED Provider Notes (Signed)
[facility] Provider Note   CSN: [id number] Arrival date & time: 05/03/22  1350     History  Chief Complaint  Patient presents with   flu sx    Jody Schultz is a 57 y.o. female with PMH thyroid disease, HTN, who presents to ED c/o fever up to 102 F, chills, generalized body aches, weakness, decreased appetite, diarrhea and abdominal cramping x 5 days.  She states that since she has arrived to the ED she is feeling slightly short of breath.  She has had no blood in her stool, neck pain, nausea, vomiting, urinary symptoms, cough, congestion, or chest pain.  She is having multiple episodes of diarrhea per day.  She has had no previous abdominal surgeries. No known sick contacts.  She states that her PCP told her to come here to "get my white blood cell count checked."      Home Medications Prior to Admission medications   Medication Sig Start Date End Date Taking? Authorizing Provider  amLODipine (NORVASC) 5 MG tablet Take 1 tablet (5 mg total) by mouth daily. 09/19/16 06/24/19  Bonnielee Haff, MD  amLODipine (NORVASC) 5 MG tablet Take 5 mg by mouth daily.    [provider]  atorvastatin (LIPITOR) 10 MG tablet atorvastatin 10 mg tablet    [provider]  diclofenac Sodium (VOLTAREN) 1 % GEL diclofenac 1 % topical gel  APP 4 GRAMS EXT AA QID    [provider]  levothyroxine (SYNTHROID) 125 MCG tablet Take 125 mcg by mouth daily. 05/23/19   [provider]  losartan (COZAAR) 100 MG tablet Take 100 mg by mouth daily. 05/23/19   [provider]  Multiple Vitamin (MULTIVITAMIN WITH MINERALS) TABS tablet Take 1 tablet by mouth daily.    [provider]  nitroGLYCERIN (NITROSTAT) 0.4 MG SL tablet Place 1 tablet (0.4 mg total) under the tongue every 5 (five) minutes as needed for chest pain. Patient not taking: Reported on 06/24/2019 09/19/16   Bonnielee Haff, MD  UNABLE TO FIND Med Name: thyroid  support herbal pill daily    [provider]  VITAMIN D PO Take by mouth.    [provider]      Allergies    Patient has no known allergies.    Review of Systems   Review of Systems  All other systems reviewed and are negative.   Physical Exam Updated Vital Signs BP 120/79 (BP Location: Right Arm)   Pulse (!) 102   Temp 98.1 F (36.7 C)   Resp 18   Ht '5\' 2"'$  (1.575 m)   Wt 117.9 kg   LMP 01/16/2019   SpO2 97%   BMI 47.55 kg/m  Physical Exam Vitals and nursing note reviewed.  Constitutional:      General: She is not in acute distress.    Appearance: Normal appearance. She is not ill-appearing, toxic-appearing or diaphoretic.  HENT:     Head: Normocephalic and atraumatic.     Mouth/Throat:     Mouth: Mucous membranes are dry.     Pharynx: Oropharynx is clear. No oropharyngeal exudate or posterior oropharyngeal erythema.  Eyes:     General: No scleral icterus.    Conjunctiva/sclera: Conjunctivae normal.  Cardiovascular:     Rate and Rhythm: Regular rhythm. Tachycardia present.     Heart sounds: Normal heart sounds. No murmur heard. Pulmonary:     Effort: Pulmonary effort is normal. No respiratory distress.     Breath  sounds: Normal breath sounds. No stridor. No wheezing, rhonchi or rales.  Abdominal:     General: Abdomen is flat. There is no distension.     Palpations: Abdomen is soft.     Tenderness: There is no abdominal tenderness. There is no right CVA tenderness, left CVA tenderness, guarding or rebound.  Musculoskeletal:        General: Normal range of motion.     Cervical back: Normal range of motion and neck supple. No rigidity.     Right lower leg: No edema.     Left lower leg: No edema.     Comments: No bilateral calf tenderness to palpation  Skin:    General: Skin is warm and dry.     Capillary Refill: Capillary refill takes 2 to 3 seconds.  Neurological:     General: No focal deficit present.     Mental Status: She is alert.  Mental status is at baseline.  Psychiatric:        Mood and Affect: Mood normal.        Behavior: Behavior normal.     ED Results / Procedures / Treatments   Labs (all labs ordered are listed, but only abnormal results are displayed) Labs Reviewed  CBC WITH DIFFERENTIAL/PLATELET - Abnormal; Notable for the following components:      Result Value   nRBC 0.4 (*)    Abs Immature Granulocytes 0.08 (*)    All other components within normal limits  COMPREHENSIVE METABOLIC PANEL - Abnormal; Notable for the following components:   Sodium 134 (*)    AST 12 (*)    Total Bilirubin 1.3 (*)    All other components within normal limits  LIPASE, BLOOD - Abnormal; Notable for the following components:   Lipase <10 (*)    All other components within normal limits  URINALYSIS, ROUTINE W REFLEX MICROSCOPIC - Abnormal; Notable for the following components:   Bilirubin Urine SMALL (*)    Ketones, ur >80 (*)    Protein, ur 30 (*)    Leukocytes,Ua TRACE (*)    All other components within normal limits  RESP PANEL BY RT-PCR (RSV, FLU A&B, COVID)  [id number]    EKG EKG Interpretation  Date/Time:  Thursday May 03 2022 15:46:51 EST Ventricular Rate:  91 PR Interval:  140 QRS Duration: 84 QT Interval:  356 QTC Calculation: 437 R Axis:   45 Text Interpretation: Normal sinus rhythm Normal ECG When compared with ECG of 18-Nov-2020 09:08, PREVIOUS ECG IS PRESENT since last tracing no significant change Confirmed by Malvin Johns [phone]) on 05/03/2022 5:15:02 PM  Radiology DG Chest 2 View  Result Date: 05/03/2022 CLINICAL DATA:  Fever EXAM: CHEST - 2 VIEW COMPARISON:  11/18/2020 FINDINGS: The heart size and mediastinal contours are within normal limits. Both lungs are clear. The visualized skeletal structures are unremarkable. IMPRESSION: No active cardiopulmonary disease. Electronically Signed   By: Davina Poke D.O.   On: 05/03/2022 15:33    Procedures Procedures    Medications Ordered in  ED Medications  sodium chloride 0.9 % bolus 1,000 mL (1,000 mLs Intravenous New Bag/Given 05/03/22 1548)    ED Course/ Medical Decision Making/ A&P                             Medical Decision Making Amount and/or Complexity of Data Reviewed Labs: ordered. Decision-making details documented in ED Course. Radiology: ordered. Decision-making details documented in ED Course. ECG/medicine tests:  ordered. Decision-making details documented in ED Course.   Medical Decision Making:   Jody Schultz is a 57 y.o. female who presented to the ED today with fever, generalized weakness, body aches, diarrhea as detailed above.    Additional history discussed with patient's family/caregivers.  Complete initial physical exam performed, notably the patient was in no acute distress with slight tachycardia with regular rhythm, lungs clear to auscultation, neurologically intact, abdomen was soft and non-tender. She appeared slightly dehydrated with dry mucus membranes and minimally delayed capillary refill. Reviewed and confirmed nursing documentation for past medical history, family history, social history.    Initial Assessment:   Differential diagnosis includes but is not limited to COVID, influenza, RSV, unspecified URI, viral illness, pneumonia, dehydration, AKI, electrolyte disturbance, pancreatitis, infectious diarrhea, UTI. This is most consistent with an acute complicated illness  Initial Plan:  Screening labs including CBC and Metabolic panel to evaluate for infectious or metabolic etiology of disease.  Lipase to evaluate for pancreatitis Urinalysis with reflex culture ordered to evaluate for UTI or relevant urologic/nephrologic pathology.  CXR to evaluate for structural/infectious intrathoracic pathology.  Viral swabs IV fluids for hydration EKG to evaluate for cardiac pathology Objective evaluation as reviewed   Initial Study Results:   Laboratory  All laboratory results reviewed without  evidence of clinically relevant pathology.   Exceptions include: Na 134, Tbili 1.3   EKG EKG was reviewed independently. ST segments without concerns for elevations.   EKG: normal EKG, normal sinus rhythm.   Radiology:  All images reviewed independently. Agree with radiology report at this time.   DG Chest 2 View  Result Date: 05/03/2022 CLINICAL DATA:  Fever EXAM: CHEST - 2 VIEW COMPARISON:  11/18/2020 FINDINGS: The heart size and mediastinal contours are within normal limits. Both lungs are clear. The visualized skeletal structures are unremarkable. IMPRESSION: No active cardiopulmonary disease. Electronically Signed   By: Davina Poke D.O.   On: 05/03/2022 15:33       Final Assessment and Plan:   This is a 57 year old female presenting to the ED for evaluation of viral symptoms as above.  Mostly, patient complains of fever, chills, body aches, and diarrhea.  She has had multiple episodes of diarrhea each day that has been nonbloody.  No known sick contacts.  Patient was concerned that she may have the flu but viral swabs for COVID, RSV, and influenza are negative.  She is encouraged by her PCP due to concern for dehydration.  She states that she is not wanting to eat or drink due to her symptoms.  She does appear clinically dehydrated on exam and has a slight tachycardia.  Her abdomen was soft, nontender, and without rebound, guarding, or peritoneal signs.  Lungs clear to auscultation and no signs of respiratory distress.  Patient able to ambulate without difficulty.  Neurologically intact.  She is afebrile here with otherwise stable vital signs.  Symptoms do not appear consistent with acute abdomen.  IV fluid hydration provided.  Overall, patient's workup is reassuring with a normal EKG, normal chest x-ray, no signs of UTI, no significant electrolyte disturbance, no AKI, and no leukocytosis.  Patient likely dealing with a viral illness.  Patient given strict ED return precautions as well as  indications for outpatient follow-up.  All questions answered and stable for discharge.   Clinical Impression:  1. Viral illness   2. Dehydration   3. Diarrhea, unspecified type      Discharge  Final Clinical Impression(s) / ED Diagnoses Final diagnoses:  Viral illness  Dehydration  Diarrhea, unspecified type    Rx / DC Orders ED Discharge Orders     None         Suzzette Righter, PA-C 05/03/22 1825    Malvin Johns, MD 05/03/22 1900

## 2022-05-03 NOTE — ED Notes (Signed)
Pt verbalized understanding of d/c instructions, meds, and followup care. Denies questions. VSS, no distress noted. Steady gait to exit with all belongings. 

## 2022-05-03 NOTE — ED Triage Notes (Signed)
Pt to ED c/o flu sx x 5 days. Reports body aches, fever, diarrhea, vomiting. Reports minimal relief with OTC medications.

## 2022-05-03 NOTE — Discharge Instructions (Addendum)
Overall, your workup today was reassuring and we do not see any significant signs of infection in your blood work or on your chest x-ray.  Your EKG was normal.  Your swabs for COVID, RSV, and the flu were negative.  However, there are many other viruses that cause similar symptoms that we do not test for.  You likely have 1 of these illnesses.  We gave you fluids to help with hydration.  Please continue to stay well-hydrated at home.  You likely are dealing with a viral illness.  Please see the instructions below on how to treat this.  Needs follow-up with her PCP next week if you continue to have symptoms or other concerns.  Viral Illness TREATMENT  Treatment is directed at relieving symptoms. There is no cure. Antibiotics are not effective because the infection is caused by a virus, not by bacteria. Treatment may include:  Increased fluid intake. Sports drinks offer valuable electrolytes, sugars, and fluids.  Breathing heated mist or steam (vaporizer or shower).  Eating chicken soup or other clear broths, and maintaining good nutrition.  Getting plenty of rest.  Using gargles or lozenges for comfort.  Increasing usage of your inhaler if you have asthma.  Return to work when your temperature has returned to normal.  Gargle warm salt water and spit it out for sore throat. Take benadryl to decrease sinus secretions. Continue to alternate between Tylenol and ibuprofen for pain and fever control.  Follow Up: Follow up with your primary care doctor in 5-7 days for recheck of ongoing symptoms.  Return to emergency department for emergent changing or worsening of symptoms.

## 2022-05-09 ENCOUNTER — Encounter: Payer: Self-pay | Admitting: Family Medicine

## 2022-05-09 ENCOUNTER — Ambulatory Visit
Admission: RE | Admit: 2022-05-09 | Discharge: 2022-05-09 | Disposition: A | Discharge status: Home / Self Care | Payer: No Typology Code available for payment source | Source: Ambulatory Visit

## 2022-05-09 ENCOUNTER — Other Ambulatory Visit: Payer: Self-pay

## 2022-05-09 ENCOUNTER — Inpatient Hospital Stay (HOSPITAL_COMMUNITY)
Admission: EM | Admit: 2022-05-09 | Discharge: 2022-05-16 | DRG: 372 | Disposition: A | Discharge status: Home / Self Care | Payer: No Typology Code available for payment source | Attending: General Surgery | Admitting: General Surgery

## 2022-05-09 ENCOUNTER — Encounter (HOSPITAL_COMMUNITY): Payer: Self-pay

## 2022-05-09 ENCOUNTER — Other Ambulatory Visit: Payer: Self-pay | Admitting: Family Medicine

## 2022-05-09 DIAGNOSIS — Z87891 Personal history of nicotine dependence: Secondary | ICD-10-CM

## 2022-05-09 DIAGNOSIS — K219 Gastro-esophageal reflux disease without esophagitis: Secondary | ICD-10-CM | POA: Diagnosis present

## 2022-05-09 DIAGNOSIS — R0683 Snoring: Secondary | ICD-10-CM | POA: Diagnosis present

## 2022-05-09 DIAGNOSIS — Z6841 Body Mass Index (BMI) 40.0 and over, adult: Secondary | ICD-10-CM | POA: Diagnosis not present

## 2022-05-09 DIAGNOSIS — R1031 Right lower quadrant pain: Secondary | ICD-10-CM

## 2022-05-09 DIAGNOSIS — E785 Hyperlipidemia, unspecified: Secondary | ICD-10-CM | POA: Diagnosis present

## 2022-05-09 DIAGNOSIS — Z79899 Other long term (current) drug therapy: Secondary | ICD-10-CM | POA: Diagnosis not present

## 2022-05-09 DIAGNOSIS — E039 Hypothyroidism, unspecified: Secondary | ICD-10-CM | POA: Diagnosis present

## 2022-05-09 DIAGNOSIS — I10 Essential (primary) hypertension: Secondary | ICD-10-CM | POA: Diagnosis present

## 2022-05-09 DIAGNOSIS — Z888 Allergy status to other drugs, medicaments and biological substances status: Secondary | ICD-10-CM | POA: Diagnosis not present

## 2022-05-09 DIAGNOSIS — Z1231 Encounter for screening mammogram for malignant neoplasm of breast: Secondary | ICD-10-CM

## 2022-05-09 DIAGNOSIS — K3532 Acute appendicitis with perforation and localized peritonitis, without abscess: Secondary | ICD-10-CM | POA: Diagnosis present

## 2022-05-09 DIAGNOSIS — Z79890 Hormone replacement therapy (postmenopausal): Secondary | ICD-10-CM | POA: Diagnosis not present

## 2022-05-09 LAB — URINALYSIS, ROUTINE W REFLEX MICROSCOPIC
Bacteria, UA: NONE SEEN
Bilirubin Urine: NEGATIVE
Glucose, UA: NEGATIVE mg/dL
Hgb urine dipstick: NEGATIVE
Ketones, ur: 20 mg/dL — AB
Nitrite: NEGATIVE
Protein, ur: NEGATIVE mg/dL
Specific Gravity, Urine: >1.046 — ABNORMAL HIGH (ref 1.005–1.030)
pH: 5 (ref 5.0–8.0)

## 2022-05-09 LAB — LACTIC ACID, PLASMA: Lactic Acid, Venous: 1.6 mmol/L (ref 0.5–1.9)

## 2022-05-09 LAB — CBC WITH DIFFERENTIAL/PLATELET
Abs Immature Granulocytes: 0.41 10*3/uL — ABNORMAL HIGH (ref 0.00–0.07)
Basophils Absolute: 0.1 10*3/uL (ref 0.0–0.1)
Basophils Relative: 1 %
Eosinophils Absolute: 0.2 10*3/uL (ref 0.0–0.5)
Eosinophils Relative: 1 %
HCT: 39.1 % (ref 36.0–46.0)
Hemoglobin: 12.9 g/dL (ref 12.0–15.0)
Immature Granulocytes: 2 %
Lymphocytes Relative: 16 %
Lymphs Abs: 2.9 10*3/uL (ref 0.7–4.0)
MCH: 31.1 pg (ref 26.0–34.0)
MCHC: 33 g/dL (ref 30.0–36.0)
MCV: 94.2 fL (ref 80.0–100.0)
Monocytes Absolute: 1.2 10*3/uL — ABNORMAL HIGH (ref 0.1–1.0)
Monocytes Relative: 7 %
Neutro Abs: 12.8 10*3/uL — ABNORMAL HIGH (ref 1.7–7.7)
Neutrophils Relative %: 73 %
Platelets: 331 10*3/uL (ref 150–400)
RBC: 4.15 MIL/uL (ref 3.87–5.11)
RDW: 12.1 % (ref 11.5–15.5)
WBC: 17.5 10*3/uL — ABNORMAL HIGH (ref 4.0–10.5)
nRBC: 0 % (ref 0.0–0.2)

## 2022-05-09 LAB — COMPREHENSIVE METABOLIC PANEL
ALT: 22 U/L (ref 0–44)
AST: 19 U/L (ref 15–41)
Albumin: 3.2 g/dL — ABNORMAL LOW (ref 3.5–5.0)
Alkaline Phosphatase: 56 U/L (ref 38–126)
Anion gap: 10 (ref 5–15)
BUN: 6 mg/dL (ref 6–20)
CO2: 26 mmol/L (ref 22–32)
Calcium: 8.5 mg/dL — ABNORMAL LOW (ref 8.9–10.3)
Chloride: 100 mmol/L (ref 98–111)
Creatinine, Ser: 0.81 mg/dL (ref 0.44–1.00)
GFR, Estimated: >60 mL/min (ref >60)
Glucose, Bld: 93 mg/dL (ref 70–99)
Potassium: 3.6 mmol/L (ref 3.5–5.1)
Sodium: 136 mmol/L (ref 135–145)
Total Bilirubin: 1 mg/dL (ref 0.3–1.2)
Total Protein: 7.5 g/dL (ref 6.5–8.1)

## 2022-05-09 LAB — LIPASE, BLOOD: Lipase: 33 U/L (ref 11–51)

## 2022-05-09 MED ORDER — LOSARTAN POTASSIUM 50 MG PO TABS
100.0000 mg | ORAL_TABLET | Freq: Every day | ORAL | Status: DC
  Administered 2022-05-10 – 2022-05-16 (×7): 100 mg via ORAL
  Filled 2022-05-09 (×7): qty 2

## 2022-05-09 MED ORDER — SODIUM CHLORIDE 0.9 % IV BOLUS
2000.0000 mL | Freq: Once | INTRAVENOUS | Status: AC
  Administered 2022-05-09: 2000 mL via INTRAVENOUS

## 2022-05-09 MED ORDER — MORPHINE SULFATE (PF) 4 MG/ML IV SOLN
4.0000 mg | Freq: Once | INTRAVENOUS | Status: AC
  Administered 2022-05-09: 4 mg via INTRAVENOUS
  Filled 2022-05-09: qty 1

## 2022-05-09 MED ORDER — ACETAMINOPHEN 650 MG RE SUPP
650.0000 mg | Freq: Four times a day (QID) | RECTAL | Status: DC | PRN
  Administered 2022-05-11: 650 mg via RECTAL
  Filled 2022-05-09: qty 1

## 2022-05-09 MED ORDER — OXYCODONE HCL 5 MG PO TABS
5.0000 mg | ORAL_TABLET | ORAL | Status: DC | PRN
  Administered 2022-05-10 – 2022-05-12 (×7): 10 mg via ORAL
  Filled 2022-05-09 (×7): qty 2

## 2022-05-09 MED ORDER — ACETAMINOPHEN 325 MG PO TABS
650.0000 mg | ORAL_TABLET | Freq: Four times a day (QID) | ORAL | Status: DC | PRN
  Administered 2022-05-10 – 2022-05-12 (×3): 650 mg via ORAL
  Filled 2022-05-09 (×3): qty 2

## 2022-05-09 MED ORDER — ATORVASTATIN CALCIUM 10 MG PO TABS
10.0000 mg | ORAL_TABLET | Freq: Every day | ORAL | Status: DC
  Administered 2022-05-10 – 2022-05-16 (×7): 10 mg via ORAL
  Filled 2022-05-09 (×7): qty 1

## 2022-05-09 MED ORDER — PIPERACILLIN-TAZOBACTAM 3.375 G IVPB
3.3750 g | Freq: Three times a day (TID) | INTRAVENOUS | Status: DC
  Administered 2022-05-09 – 2022-05-13 (×11): 3.375 g via INTRAVENOUS
  Filled 2022-05-09 (×11): qty 50

## 2022-05-09 MED ORDER — ONDANSETRON 4 MG PO TBDP: 4.0000 mg | ORAL_TABLET | Freq: Four times a day (QID) | ORAL | Status: DC | PRN

## 2022-05-09 MED ORDER — AMLODIPINE BESYLATE 5 MG PO TABS
5.0000 mg | ORAL_TABLET | Freq: Every day | ORAL | Status: DC
  Administered 2022-05-10 – 2022-05-16 (×6): 5 mg via ORAL
  Filled 2022-05-09 (×7): qty 1

## 2022-05-09 MED ORDER — PIPERACILLIN-TAZOBACTAM 3.375 G IVPB 30 MIN
3.3750 g | Freq: Once | INTRAVENOUS | Status: AC
  Administered 2022-05-09: 3.375 g via INTRAVENOUS
  Filled 2022-05-09: qty 50

## 2022-05-09 MED ORDER — IOPAMIDOL (ISOVUE-300) INJECTION 61%
100.0000 mL | Freq: Once | INTRAVENOUS | Status: AC | PRN
  Administered 2022-05-09: 100 mL via INTRAVENOUS

## 2022-05-09 MED ORDER — ONDANSETRON HCL 4 MG/2ML IJ SOLN
4.0000 mg | Freq: Once | INTRAMUSCULAR | Status: AC
  Administered 2022-05-09: 4 mg via INTRAVENOUS
  Filled 2022-05-09: qty 2

## 2022-05-09 MED ORDER — MORPHINE SULFATE (PF) 2 MG/ML IV SOLN
1.0000 mg | INTRAVENOUS | Status: DC | PRN
  Administered 2022-05-09 – 2022-05-11 (×6): 1 mg via INTRAVENOUS
  Filled 2022-05-09 (×7): qty 1

## 2022-05-09 MED ORDER — SODIUM CHLORIDE 0.9 % IV SOLN: INTRAVENOUS | Status: DC

## 2022-05-09 MED ORDER — LOSARTAN POTASSIUM 25 MG PO TABS: 100.0000 mg | ORAL_TABLET | Freq: Every day | ORAL | Status: DC

## 2022-05-09 MED ORDER — LEVOTHYROXINE SODIUM 125 MCG PO TABS
125.0000 ug | ORAL_TABLET | Freq: Every day | ORAL | Status: DC
  Administered 2022-05-10 – 2022-05-16 (×7): 125 ug via ORAL
  Filled 2022-05-09 (×7): qty 1

## 2022-05-09 MED ORDER — ONDANSETRON HCL 4 MG/2ML IJ SOLN
4.0000 mg | Freq: Four times a day (QID) | INTRAMUSCULAR | Status: DC | PRN
  Administered 2022-05-09 – 2022-05-14 (×5): 4 mg via INTRAVENOUS
  Filled 2022-05-09 (×5): qty 2

## 2022-05-09 MED ORDER — ENOXAPARIN SODIUM 40 MG/0.4ML IJ SOSY
40.0000 mg | PREFILLED_SYRINGE | INTRAMUSCULAR | Status: DC
  Administered 2022-05-09 – 2022-05-15 (×7): 40 mg via SUBCUTANEOUS
  Filled 2022-05-09 (×7): qty 0.4

## 2022-05-09 MED ORDER — MORPHINE SULFATE (PF) 4 MG/ML IV SOLN: 4.0000 mg | Freq: Once | INTRAVENOUS | Status: DC

## 2022-05-09 MED ORDER — SIMETHICONE 80 MG PO CHEW: 40.0000 mg | CHEWABLE_TABLET | Freq: Four times a day (QID) | ORAL | Status: DC | PRN

## 2022-05-09 NOTE — Progress Notes (Signed)
Pharmacy Antibiotic Note  Jody Schultz is a 57 y.o. female who presented to the ED on 05/09/2022 with c/o diarrhea, nausea and abdominal pain.  Abdominal CT on 05/09/22 showed perforated appendicitis. Pharmacy has been consulted to dose zosyn for intra-abdominal infection.  Plan: - zosyn 3.375 gm IV q8h (infuse over 4 hrs) - With good renal function, pharmacy will sign off for abx consult.  Reconsult [place] if need further assistance.  ________________________________________________  Temp (24hrs), Avg:98.2 F (36.8 C), Min:98.2 F (36.8 C), Max:98.2 F (36.8 C)  Recent Labs  Lab 05/03/22 1546 05/09/22 1622  WBC 5.2 17.5*  CREATININE 0.92 0.81  LATICACIDVEN  --  1.6    Estimated Creatinine Clearance: 92.3 mL/min (by C-G formula based on SCr of 0.81 mg/dL).    Allergies  Allergen Reactions   Levonorgestrel-Ethinyl Estrad Other (See Comments)     Thank you for allowing pharmacy to be a part of this patient's care.  Lynelle Doctor 05/09/2022 6:21 PM

## 2022-05-09 NOTE — H&P (Signed)
Jody Schultz is an 57 y.o. female.   Chief Complaint: ab pain HPI: 26 yof with 11 days of ab pain. This started with generalized abdominal pain, diarrhea. She has not been hungry or eating much for 11 days.  The diarrhea stopped. She is passing flatus. She has prior csc and is not due for another couple of years she thinks (she did have polyps).  This worsened and she was seen in ER 6 days ago. Thought to have a viral illness and was discharged home.  She continued to feel poorly. She states she had abdominal pain and tenderness for last week that was significant.  She underwent outpatient ct after symptoms have not abated and was found to have perforated appendicitis.  She was referred to the ER.  Past Medical History:  Diagnosis Date   Allergy    Chicken pox    Depression    GERD (gastroesophageal reflux disease)    Hyperlipidemia    Hypertension    Increased frequency of headaches    Thyroid disease     Past Surgical History:  Procedure Laterality Date   CESAREAN SECTION     CYSTECTOMY      Family History  Problem Relation Age of Onset   Arthritis Mother    Stroke Father    Esophageal cancer Father    Cancer Maternal Grandmother    Cancer Paternal Grandmother    Colon cancer Neg Hx    Colon polyps Neg Hx    Stomach cancer Neg Hx    Rectal cancer Neg Hx    Social History:  reports that she quit smoking about 34 years ago. Her smoking use included cigarettes. She has never used smokeless tobacco. She reports current alcohol use of about 1.0 standard drink of alcohol per week. She reports that she does not use drugs.  Allergies:  Allergies  Allergen Reactions   Levonorgestrel-Ethinyl Estrad Other (See Comments)    (Not in a hospital admission)   Results for orders placed or performed during the hospital encounter of 05/09/22 (from the past 48 hour(s))  CBC with Differential     Status: Abnormal   Collection Time: 05/09/22  4:22 PM  Result Value Ref Range   WBC 17.5  (H) 4.0 - 10.5 K/uL   RBC 4.15 3.87 - 5.11 MIL/uL   Hemoglobin 12.9 12.0 - 15.0 g/dL   HCT 39.1 36.0 - 46.0 %   MCV 94.2 80.0 - 100.0 fL   MCH 31.1 26.0 - 34.0 pg   MCHC 33.0 30.0 - 36.0 g/dL   RDW 12.1 11.5 - 15.5 %   Platelets 331 150 - 400 K/uL   nRBC 0.0 0.0 - 0.2 %   Neutrophils Relative % 73 %   Neutro Abs 12.8 (H) 1.7 - 7.7 K/uL   Lymphocytes Relative 16 %   Lymphs Abs 2.9 0.7 - 4.0 K/uL   Monocytes Relative 7 %   Monocytes Absolute 1.2 (H) 0.1 - 1.0 K/uL   Eosinophils Relative 1 %   Eosinophils Absolute 0.2 0.0 - 0.5 K/uL   Basophils Relative 1 %   Basophils Absolute 0.1 0.0 - 0.1 K/uL   Immature Granulocytes 2 %   Abs Immature Granulocytes 0.41 (H) 0.00 - 0.07 K/uL    Comment: Performed at [facility], [place] [address], [place], [place] [address]  Comprehensive metabolic panel     Status: Abnormal   Collection Time: 05/09/22  4:22 PM  Result Value Ref Range   Sodium 136  135 - 145 mmol/L   Potassium 3.6 3.5 - 5.1 mmol/L   Chloride 100 98 - 111 mmol/L   CO2 26 22 - 32 mmol/L   Glucose, Bld 93 70 - 99 mg/dL    Comment: Glucose reference range applies only to samples taken after fasting for at least 8 hours.   BUN 6 6 - 20 mg/dL   Creatinine, Ser 0.81 0.44 - 1.00 mg/dL   Calcium 8.5 (L) 8.9 - 10.3 mg/dL   Total Protein 7.5 6.5 - 8.1 g/dL   Albumin 3.2 (L) 3.5 - 5.0 g/dL   AST 19 15 - 41 U/L   ALT 22 0 - 44 U/L   Alkaline Phosphatase 56 38 - 126 U/L   Total Bilirubin 1.0 0.3 - 1.2 mg/dL   GFR, Estimated >60 >60 mL/min    Comment: (NOTE) Calculated using the CKD-EPI Creatinine Equation (2021)    Anion gap 10 5 - 15    Comment: Performed at [facility], [place] [address], [place], [place] [address]  Lipase, blood     Status: None   Collection Time: 05/09/22  4:22 PM  Result Value Ref Range   Lipase 33 11 - 51 U/L    Comment: Performed at [facility], [place] [address] [place], [place] [address]  Lactic acid,  plasma     Status: None   Collection Time: 05/09/22  4:22 PM  Result Value Ref Range   Lactic Acid, Venous 1.6 0.5 - 1.9 mmol/L    Comment: Performed at [facility], [place] [address], [place],  [address]   CT ABDOMEN PELVIS W CONTRAST  Result Date: 05/09/2022 CLINICAL DATA:  57 year old female presenting for evaluation of RIGHT lower quadrant pain and a abdominal pain with diarrhea and constipation intermittently for 10 days. EXAM: CT ABDOMEN AND PELVIS WITH CONTRAST TECHNIQUE: Multidetector CT imaging of the abdomen and pelvis was performed using the standard protocol following bolus administration of intravenous contrast. RADIATION DOSE REDUCTION: This exam was performed according to the departmental dose-optimization program which includes automated exposure control, adjustment of the mA and/or kV according to patient size and/or use of iterative reconstruction technique. CONTRAST:  146m ISOVUE-300 IOPAMIDOL (ISOVUE-300) INJECTION 61% COMPARISON:  None available. FINDINGS: Lower chest: Incidental imaging of the lung bases without effusion or consolidation. No chest wall abnormality. Hepatobiliary: No focal, suspicious hepatic lesion. No pericholecystic stranding. No biliary duct dilation. Portal vein is patent. Cholelithiasis with nearly 1 cm gallstone in the neck of the gallbladder. Pancreas: Mild fatty atrophy of the pancreas without ductal dilation or sign of inflammation. Spleen: Normal. Adrenals/Urinary Tract: Adrenal glands are unremarkable. Symmetric renal enhancement. No sign of hydronephrosis. No suspicious renal lesion or perinephric stranding. Urinary bladder is grossly unremarkable. Some stranding adjacent to the urinary bladder arises from perforated appendix, see above. Stomach/Bowel: Stomach under distended. No signs of small bowel dilation or acute small bowel process aside from secondary inflammation arising from inflammatory changes in the setting of appendiceal  rupture due to acute appendicitis. Phlegmon in the mesoappendix, hyperenhancement of the appendiceal wall and dilation of the appendix up to 13 mm. Base of the appendix however with dilation up to 15 mm. No well-formed abscess. Diverticular changes, query concomitant mild inflammation along the LEFT colon distant from the appendix versus is more disseminated inflammation along the mesenteric margin of the descending colon. Vascular/Lymphatic: Scattered small lymph nodes throughout the retroperitoneum. None with pathologic enlargement. Aortic atherosclerotic changes without aneurysmal dilation. Reproductive: Intense  inflammation in the RIGHT adnexa in the setting of perforated appendicitis. No well-formed abscess. Phlegmon in the mesoappendix along the mesenteric border of the appendix measuring 3.9 x 1.9 cm in the coronal plane. No ascites elsewhere or signs of free air. Other: As above Musculoskeletal: No acute or significant osseous findings. IMPRESSION: 1. Acute appendicitis with signs of appendiceal rupture. Phlegmon in the mesoappendix, hyperenhancement of the appendiceal wall and dilation of the appendix up to 15-13 mm. No well-formed abscess currently but at risk for developing abscess. Based on diameter of appendiceal lumen the possibility of baseline appendiceal mucocele/mucinous neoplasm would be difficult to exclude. Again, the patient does show signs of perforated acute appendicitis. 2. No free air. 3. Mild inflammation along the mesenteric margin of the sigmoid in the setting of diverticular changes may be secondary and disseminated from the RIGHT pelvis. The possibility of mild concomitant acute diverticulitis is considered. 4. Cholelithiasis with nearly 1 cm gallstone in the neck of the gallbladder. 5. Aortic atherosclerotic changes. 6. Aortic atherosclerosis. Aortic Atherosclerosis (ICD10-I70.0). These results were called by telephone at the time of interpretation on 05/09/2022 at 2:58 pm to provider  Shaune Pollack ( NP ) , who verbally acknowledged these results. Electronically Signed   By: Zetta Bills M.D.   On: 05/09/2022 14:58    Review of Systems  Constitutional:  Positive for appetite change (loss of) and fever.  Gastrointestinal:  Positive for abdominal pain, diarrhea and nausea.  All other systems reviewed and are negative.   Blood pressure 116/62, pulse 68, temperature 98.2 F (36.8 C), resp. rate 12, last menstrual period 01/16/2019, SpO2 100 %. Physical Exam Vitals reviewed.  Constitutional:      Appearance: She is well-developed. She is obese.  Eyes:     General: No scleral icterus. Cardiovascular:     Rate and Rhythm: Normal rate.  Pulmonary:     Effort: Pulmonary effort is normal.  Abdominal:     General: There is no distension.     Palpations: Abdomen is soft.     Tenderness: There is abdominal tenderness in the right lower quadrant.     Hernia: No hernia is present.  Skin:    Capillary Refill: Capillary refill takes less than 2 seconds.  Neurological:     General: No focal deficit present.     Mental Status: She is alert.  Psychiatric:        Mood and Affect: Mood normal.        Behavior: Behavior normal.      Assessment/Plan Perforated appendicitis -she is not toxic now and has no urgent indication for surgery -she has had this process for over 10 days at this point and surgery certainly carries some risk of open surgery and possible bowel resection.  I think best plan is admission, abx, will let her have clears. -discussed this might get better with abx alone ( I dont think anything to drain right now), repeat ct and drainage of if does not get better may just need surgery -if resolves can consider interval appy and will need a csc repeated as well -discussed with patient and her husband   I reviewed ED provider notes, last 24 h vitals and pain scores, last 48 h intake and output, last 24 h labs and trends, and last 24 h imaging results.  This care  required moderate level of medical decision making.    Rolm Bookbinder, MD 05/09/2022, 6:06 PM

## 2022-05-09 NOTE — ED Notes (Signed)
ED TO INPATIENT HANDOFF REPORT  Name/Age/Gender Jody Schultz 57 y.o. female  Code Status    Code Status Orders  (From admission, onward)           Start     Ordered   05/09/22 1748  Full code  Continuous       Question:  By:  Answer:  Consent: discussion documented in EHR   05/09/22 1749           Code Status History     Date Active Date Inactive Code Status Order ID Comments User Context   09/19/2016 0547 09/19/2016 2205 Full Code [id number]  Rise Patience, MD ED       Home/SNF/Other Home  Chief Complaint Perforated appendicitis [K35.32]  Level of Care/Admitting Diagnosis ED Disposition     ED Disposition  Admit   Condition  --   [facility]: [facility] [[id number]]  Level of Care: Med-Surg [16]  May admit patient to [facility] or [facility] if equivalent level of care is available:: No  Covid Evaluation: Asymptomatic - no recent exposure (last 10 days) testing not required  Diagnosis: Perforated appendicitis [id number]  Admitting Physician: [place], MD [[id number]]  Attending Physician: CCS, MD [id number]  Certification:: I certify this patient will need inpatient services for at least 2 midnights  Estimated Length of Stay: 4          Medical History Past Medical History:  Diagnosis Date   Allergy    Chicken pox    Depression    GERD (gastroesophageal reflux disease)    Hyperlipidemia    Hypertension    Increased frequency of headaches    Thyroid disease     Allergies Allergies  Allergen Reactions   Levonorgestrel-Ethinyl Estrad Other (See Comments)    IV Location/Drains/Wounds Patient Lines/Drains/Airways Status     Active Line/Drains/Airways     Name Placement date Placement time Site Days   Peripheral IV 05/09/22 20 G Right Antecubital 05/09/22  1618  Antecubital  less than 1            Labs/Imaging Results for orders placed or performed during the hospital encounter of 05/09/22 (from the past 48  hour(s))  CBC with Differential     Status: Abnormal   Collection Time: 05/09/22  4:22 PM  Result Value Ref Range   WBC 17.5 (H) 4.0 - 10.5 K/uL   RBC 4.15 3.87 - 5.11 MIL/uL   Hemoglobin 12.9 12.0 - 15.0 g/dL   HCT 39.1 36.0 - 46.0 %   MCV 94.2 80.0 - 100.0 fL   MCH 31.1 26.0 - 34.0 pg   MCHC 33.0 30.0 - 36.0 g/dL   RDW 12.1 11.5 - 15.5 %   Platelets 331 150 - 400 K/uL   nRBC 0.0 0.0 - 0.2 %   Neutrophils Relative % 73 %   Neutro Abs 12.8 (H) 1.7 - 7.7 K/uL   Lymphocytes Relative 16 %   Lymphs Abs 2.9 0.7 - 4.0 K/uL   Monocytes Relative 7 %   Monocytes Absolute 1.2 (H) 0.1 - 1.0 K/uL   Eosinophils Relative 1 %   Eosinophils Absolute 0.2 0.0 - 0.5 K/uL   Basophils Relative 1 %   Basophils Absolute 0.1 0.0 - 0.1 K/uL   Immature Granulocytes 2 %   Abs Immature Granulocytes 0.41 (H) 0.00 - 0.07 K/uL    Comment: Performed at [facility], [place] [address] [place], [place] [address]  Comprehensive metabolic panel  Status: Abnormal   Collection Time: 05/09/22  4:22 PM  Result Value Ref Range   Sodium 136 135 - 145 mmol/L   Potassium 3.6 3.5 - 5.1 mmol/L   Chloride 100 98 - 111 mmol/L   CO2 26 22 - 32 mmol/L   Glucose, Bld 93 70 - 99 mg/dL    Comment: Glucose reference range applies only to samples taken after fasting for at least 8 hours.   BUN 6 6 - 20 mg/dL   Creatinine, Ser 0.81 0.44 - 1.00 mg/dL   Calcium 8.5 (L) 8.9 - 10.3 mg/dL   Total Protein 7.5 6.5 - 8.1 g/dL   Albumin 3.2 (L) 3.5 - 5.0 g/dL   AST 19 15 - 41 U/L   ALT 22 0 - 44 U/L   Alkaline Phosphatase 56 38 - 126 U/L   Total Bilirubin 1.0 0.3 - 1.2 mg/dL   GFR, Estimated >60 >60 mL/min    Comment: (NOTE) Calculated using the CKD-EPI Creatinine Equation (2021)    Anion gap 10 5 - 15    Comment: Performed at [facility], [place] [address], [place], [place] [address]  Lipase, blood     Status: None   Collection Time: 05/09/22  4:22 PM  Result Value Ref Range   Lipase 33 11  - 51 U/L    Comment: Performed at [facility], [place] [address] [place], [place] [address]  Lactic acid, plasma     Status: None   Collection Time: 05/09/22  4:22 PM  Result Value Ref Range   Lactic Acid, Venous 1.6 0.5 - 1.9 mmol/L    Comment: Performed at [facility], [place] [address] [place], [place] [address]   CT ABDOMEN PELVIS W CONTRAST  Result Date: 05/09/2022 CLINICAL DATA:  57 year old female presenting for evaluation of RIGHT lower quadrant pain and a abdominal pain with diarrhea and constipation intermittently for 10 days. EXAM: CT ABDOMEN AND PELVIS WITH CONTRAST TECHNIQUE: Multidetector CT imaging of the abdomen and pelvis was performed using the standard protocol following bolus administration of intravenous contrast. RADIATION DOSE REDUCTION: This exam was performed according to the departmental dose-optimization program which includes automated exposure control, adjustment of the mA and/or kV according to patient size and/or use of iterative reconstruction technique. CONTRAST:  141m ISOVUE-300 IOPAMIDOL (ISOVUE-300) INJECTION 61% COMPARISON:  None available. FINDINGS: Lower chest: Incidental imaging of the lung bases without effusion or consolidation. No chest wall abnormality. Hepatobiliary: No focal, suspicious hepatic lesion. No pericholecystic stranding. No biliary duct dilation. Portal vein is patent. Cholelithiasis with nearly 1 cm gallstone in the neck of the gallbladder. Pancreas: Mild fatty atrophy of the pancreas without ductal dilation or sign of inflammation. Spleen: Normal. Adrenals/Urinary Tract: Adrenal glands are unremarkable. Symmetric renal enhancement. No sign of hydronephrosis. No suspicious renal lesion or perinephric stranding. Urinary bladder is grossly unremarkable. Some stranding adjacent to the urinary bladder arises from perforated appendix, see above. Stomach/Bowel: Stomach under distended. No signs of small bowel dilation  or acute small bowel process aside from secondary inflammation arising from inflammatory changes in the setting of appendiceal rupture due to acute appendicitis. Phlegmon in the mesoappendix, hyperenhancement of the appendiceal wall and dilation of the appendix up to 13 mm. Base of the appendix however with dilation up to 15 mm. No well-formed abscess. Diverticular changes, query concomitant mild inflammation along the LEFT colon distant from the appendix versus is more disseminated inflammation along the mesenteric margin of the descending colon. Vascular/Lymphatic:  Scattered small lymph nodes throughout the retroperitoneum. None with pathologic enlargement. Aortic atherosclerotic changes without aneurysmal dilation. Reproductive: Intense inflammation in the RIGHT adnexa in the setting of perforated appendicitis. No well-formed abscess. Phlegmon in the mesoappendix along the mesenteric border of the appendix measuring 3.9 x 1.9 cm in the coronal plane. No ascites elsewhere or signs of free air. Other: As above Musculoskeletal: No acute or significant osseous findings. IMPRESSION: 1. Acute appendicitis with signs of appendiceal rupture. Phlegmon in the mesoappendix, hyperenhancement of the appendiceal wall and dilation of the appendix up to 15-13 mm. No well-formed abscess currently but at risk for developing abscess. Based on diameter of appendiceal lumen the possibility of baseline appendiceal mucocele/mucinous neoplasm would be difficult to exclude. Again, the patient does show signs of perforated acute appendicitis. 2. No free air. 3. Mild inflammation along the mesenteric margin of the sigmoid in the setting of diverticular changes may be secondary and disseminated from the RIGHT pelvis. The possibility of mild concomitant acute diverticulitis is considered. 4. Cholelithiasis with nearly 1 cm gallstone in the neck of the gallbladder. 5. Aortic atherosclerotic changes. 6. Aortic atherosclerosis. Aortic  Atherosclerosis (ICD10-I70.0). These results were called by telephone at the time of interpretation on 05/09/2022 at 2:58 pm to provider Shaune Pollack ( NP ) , who verbally acknowledged these results. Electronically Signed   By: Zetta Bills M.D.   On: 05/09/2022 14:58    Pending Labs Unresulted Labs (From admission, onward)     Start     Ordered   05/16/22 0500  Creatinine, serum  (enoxaparin (LOVENOX)    CrCl >/= 30 ml/min)  Weekly,   R     Comments: while on enoxaparin therapy    05/09/22 1749   05/10/22 0500  CBC  Tomorrow morning,   R        05/09/22 1749   05/10/22 [id number]  Basic metabolic panel  Tomorrow morning,   R        05/09/22 1749   05/09/22 1559  Urinalysis, Routine w reflex microscopic -Urine, Clean Catch  (ED Abdominal Pain)  Once,   URGENT       Question:  Specimen Source  Answer:  Urine, Clean Catch   05/09/22 1558            Vitals/Pain Today's Vitals   05/09/22 1600 05/09/22 1615 05/09/22 1630 05/09/22 1730  BP: 125/85 107/70 (!) 112/51 116/62  Pulse: (!) 103 87 79 68  Resp:  '15 19 12  '$ Temp:      SpO2: 99% 98% 98% 100%  PainSc:        Isolation Precautions No active isolations  Medications Medications  enoxaparin (LOVENOX) injection 40 mg (has no administration in time range)  0.9 %  sodium chloride infusion (has no administration in time range)  acetaminophen (TYLENOL) tablet 650 mg (has no administration in time range)    Or  acetaminophen (TYLENOL) suppository 650 mg (has no administration in time range)  oxyCODONE (Oxy IR/ROXICODONE) immediate release tablet 5-10 mg (has no administration in time range)  morphine (PF) 2 MG/ML injection 1 mg (has no administration in time range)  ondansetron (ZOFRAN-ODT) disintegrating tablet 4 mg (has no administration in time range)    Or  ondansetron (ZOFRAN) injection 4 mg (has no administration in time range)  simethicone (MYLICON) chewable tablet 40 mg (has no administration in time range)  amLODipine  (NORVASC) tablet 5 mg (has no administration in time range)  atorvastatin (LIPITOR) tablet 10 mg (has  no administration in time range)  levothyroxine (SYNTHROID) tablet 125 mcg (has no administration in time range)  losartan (COZAAR) tablet 100 mg (has no administration in time range)  piperacillin-tazobactam (ZOSYN) IVPB 3.375 g (0 g Intravenous Stopped 05/09/22 1715)  morphine (PF) 4 MG/ML injection 4 mg (4 mg Intravenous Given 05/09/22 1638)  ondansetron (ZOFRAN) injection 4 mg (4 mg Intravenous Given 05/09/22 1638)  sodium chloride 0.9 % bolus 2,000 mL (2,000 mLs Intravenous New Bag/Given 05/09/22 1639)    Mobility walks

## 2022-05-09 NOTE — ED Triage Notes (Signed)
C/o RLQ pain and diarrhea since 3/2, nausea started yesterday.  Pt sent for abnormal CT

## 2022-05-09 NOTE — ED Provider Notes (Signed)
[place] EMERGENCY DEPARTMENT AT [facility] Provider Note   CSN: [id number] Arrival date & time: 05/09/22  1542     History  Chief Complaint  Patient presents with   Abdominal Pain    Jody Schultz is a 57 y.o. female.   Abdominal Pain  Patient is a 57 year old female with past medical history significant for hypothyroidism, hypertension, reflux, hyperlipidemia, obesity, past surgical history includes C-section and cytectomy.   Patient presents emergency room today with complaints of right lower quadrant abdominal pain.  It is severe constant and does not seem to be improved by anything.  Worse with movement and bumps in the road.  She endorses fevers at home.  Nausea without vomiting.  She states that she has had symptoms since 11 days ago and have been progressively worsening.  On Monday she was started on ciprofloxacin by her PCP and had an outpatient CT scan ordered which was done today and showed acute appendicitis with rupture.  Last PO: 2 saltine crackers at 7am      Home Medications Prior to Admission medications   Medication Sig Start Date End Date Taking? Authorizing Provider  amLODipine (NORVASC) 5 MG tablet Take 1 tablet (5 mg total) by mouth daily. 09/19/16 06/24/19  Bonnielee Haff, MD  amLODipine (NORVASC) 5 MG tablet Take 5 mg by mouth daily.    [provider]  atorvastatin (LIPITOR) 10 MG tablet atorvastatin 10 mg tablet    [provider]  diclofenac Sodium (VOLTAREN) 1 % GEL diclofenac 1 % topical gel  APP 4 GRAMS EXT AA QID    [provider]  levothyroxine (SYNTHROID) 125 MCG tablet Take 125 mcg by mouth daily. 05/23/19   [provider]  losartan (COZAAR) 100 MG tablet Take 100 mg by mouth daily. 05/23/19   [provider]  Multiple Vitamin (MULTIVITAMIN WITH MINERALS) TABS tablet Take 1 tablet by mouth daily.    [provider]  nitroGLYCERIN (NITROSTAT) 0.4 MG SL tablet Place 1 tablet  (0.4 mg total) under the tongue every 5 (five) minutes as needed for chest pain. Patient not taking: Reported on 06/24/2019 09/19/16   Bonnielee Haff, MD  UNABLE TO FIND Med Name: thyroid support herbal pill daily    [provider]  VITAMIN D PO Take by mouth.    [provider]      Allergies    Levonorgestrel-ethinyl estrad    Review of Systems   Review of Systems  Gastrointestinal:  Positive for abdominal pain.    Physical Exam Updated Vital Signs BP 116/62   Pulse 68   Temp 98.2 F (36.8 C)   Resp 12   LMP 01/16/2019   SpO2 100%  Physical Exam Vitals and nursing note reviewed.  Constitutional:      Comments: 57 year old female appears quite uncomfortable but nontoxic.  HENT:     Head: Normocephalic and atraumatic.     Nose: Nose normal.  Eyes:     General: No scleral icterus. Cardiovascular:     Rate and Rhythm: Normal rate and regular rhythm.     Pulses: Normal pulses.     Heart sounds: Normal heart sounds.  Pulmonary:     Effort: Pulmonary effort is normal. No respiratory distress.     Breath sounds: No wheezing.  Abdominal:     Palpations: Abdomen is soft.     Tenderness: There is abdominal tenderness in the right lower quadrant.  Musculoskeletal:     Cervical back: Normal range of  motion.     Right lower leg: No edema.     Left lower leg: No edema.  Skin:    General: Skin is warm and dry.     Capillary Refill: Capillary refill takes less than 2 seconds.  Neurological:     Mental Status: She is alert. Mental status is at baseline.  Psychiatric:        Mood and Affect: Mood normal.        Behavior: Behavior normal.     ED Results / Procedures / Treatments   Labs (all labs ordered are listed, but only abnormal results are displayed) Labs Reviewed  CBC WITH DIFFERENTIAL/PLATELET - Abnormal; Notable for the following components:      Result Value   WBC 17.5 (*)    Neutro Abs 12.8 (*)    Monocytes Absolute 1.2 (*)    Abs Immature  Granulocytes 0.41 (*)    All other components within normal limits  COMPREHENSIVE METABOLIC PANEL - Abnormal; Notable for the following components:   Calcium 8.5 (*)    Albumin 3.2 (*)    All other components within normal limits  LIPASE, BLOOD  LACTIC ACID, PLASMA  URINALYSIS, ROUTINE W REFLEX MICROSCOPIC  CBC  BASIC METABOLIC PANEL    EKG EKG Interpretation  Date/Time:  Wednesday May 09 2022 16:05:38 EDT Ventricular Rate:  91 PR Interval:  139 QRS Duration: 93 QT Interval:  335 QTC Calculation: 413 R Axis:   33 Text Interpretation: Sinus rhythm Low voltage, precordial leads Confirmed by Octaviano Glow [phone]) on 05/09/2022 4:23:23 PM  Radiology CT ABDOMEN PELVIS W CONTRAST  Result Date: 05/09/2022 CLINICAL DATA:  57 year old female presenting for evaluation of RIGHT lower quadrant pain and a abdominal pain with diarrhea and constipation intermittently for 10 days. EXAM: CT ABDOMEN AND PELVIS WITH CONTRAST TECHNIQUE: Multidetector CT imaging of the abdomen and pelvis was performed using the standard protocol following bolus administration of intravenous contrast. RADIATION DOSE REDUCTION: This exam was performed according to the departmental dose-optimization program which includes automated exposure control, adjustment of the mA and/or kV according to patient size and/or use of iterative reconstruction technique. CONTRAST:  141m ISOVUE-300 IOPAMIDOL (ISOVUE-300) INJECTION 61% COMPARISON:  None available. FINDINGS: Lower chest: Incidental imaging of the lung bases without effusion or consolidation. No chest wall abnormality. Hepatobiliary: No focal, suspicious hepatic lesion. No pericholecystic stranding. No biliary duct dilation. Portal vein is patent. Cholelithiasis with nearly 1 cm gallstone in the neck of the gallbladder. Pancreas: Mild fatty atrophy of the pancreas without ductal dilation or sign of inflammation. Spleen: Normal. Adrenals/Urinary Tract: Adrenal glands are  unremarkable. Symmetric renal enhancement. No sign of hydronephrosis. No suspicious renal lesion or perinephric stranding. Urinary bladder is grossly unremarkable. Some stranding adjacent to the urinary bladder arises from perforated appendix, see above. Stomach/Bowel: Stomach under distended. No signs of small bowel dilation or acute small bowel process aside from secondary inflammation arising from inflammatory changes in the setting of appendiceal rupture due to acute appendicitis. Phlegmon in the mesoappendix, hyperenhancement of the appendiceal wall and dilation of the appendix up to 13 mm. Base of the appendix however with dilation up to 15 mm. No well-formed abscess. Diverticular changes, query concomitant mild inflammation along the LEFT colon distant from the appendix versus is more disseminated inflammation along the mesenteric margin of the descending colon. Vascular/Lymphatic: Scattered small lymph nodes throughout the retroperitoneum. None with pathologic enlargement. Aortic atherosclerotic changes without aneurysmal dilation. Reproductive: Intense inflammation in the RIGHT adnexa in the setting  of perforated appendicitis. No well-formed abscess. Phlegmon in the mesoappendix along the mesenteric border of the appendix measuring 3.9 x 1.9 cm in the coronal plane. No ascites elsewhere or signs of free air. Other: As above Musculoskeletal: No acute or significant osseous findings. IMPRESSION: 1. Acute appendicitis with signs of appendiceal rupture. Phlegmon in the mesoappendix, hyperenhancement of the appendiceal wall and dilation of the appendix up to 15-13 mm. No well-formed abscess currently but at risk for developing abscess. Based on diameter of appendiceal lumen the possibility of baseline appendiceal mucocele/mucinous neoplasm would be difficult to exclude. Again, the patient does show signs of perforated acute appendicitis. 2. No free air. 3. Mild inflammation along the mesenteric margin of the  sigmoid in the setting of diverticular changes may be secondary and disseminated from the RIGHT pelvis. The possibility of mild concomitant acute diverticulitis is considered. 4. Cholelithiasis with nearly 1 cm gallstone in the neck of the gallbladder. 5. Aortic atherosclerotic changes. 6. Aortic atherosclerosis. Aortic Atherosclerosis (ICD10-I70.0). These results were called by telephone at the time of interpretation on 05/09/2022 at 2:58 pm to provider Shaune Pollack ( NP ) , who verbally acknowledged these results. Electronically Signed   By: Zetta Bills M.D.   On: 05/09/2022 14:58    Procedures .Critical Care  Performed by: Tedd Sias, PA Authorized by: Tedd Sias, PA   Critical care provider statement:    Critical care time (minutes):  35   Critical care time was exclusive of:  Separately billable procedures and treating other patients and teaching time   Critical care was necessary to treat or prevent imminent or life-threatening deterioration of the following conditions: bowel perforation and intra-abdominal absces.   Critical care was time spent personally by me on the following activities:  Development of treatment plan with patient or surrogate, review of old charts, re-evaluation of patient's condition, pulse oximetry, ordering and review of radiographic studies, ordering and review of laboratory studies, ordering and performing treatments and interventions, obtaining history from patient or surrogate, examination of patient and evaluation of patient's response to treatment   Care discussed with: admitting provider       Medications Ordered in ED Medications  enoxaparin (LOVENOX) injection 40 mg (has no administration in time range)  0.9 %  sodium chloride infusion (has no administration in time range)  acetaminophen (TYLENOL) tablet 650 mg (has no administration in time range)    Or  acetaminophen (TYLENOL) suppository 650 mg (has no administration in time range)   oxyCODONE (Oxy IR/ROXICODONE) immediate release tablet 5-10 mg (has no administration in time range)  morphine (PF) 2 MG/ML injection 1 mg (has no administration in time range)  ondansetron (ZOFRAN-ODT) disintegrating tablet 4 mg (has no administration in time range)    Or  ondansetron (ZOFRAN) injection 4 mg (has no administration in time range)  simethicone (MYLICON) chewable tablet 40 mg (has no administration in time range)  amLODipine (NORVASC) tablet 5 mg (has no administration in time range)  atorvastatin (LIPITOR) tablet 10 mg (has no administration in time range)  levothyroxine (SYNTHROID) tablet 125 mcg (has no administration in time range)  losartan (COZAAR) tablet 100 mg (has no administration in time range)  piperacillin-tazobactam (ZOSYN) IVPB 3.375 g (has no administration in time range)  piperacillin-tazobactam (ZOSYN) IVPB 3.375 g (0 g Intravenous Stopped 05/09/22 1715)  morphine (PF) 4 MG/ML injection 4 mg (4 mg Intravenous Given 05/09/22 1638)  ondansetron (ZOFRAN) injection 4 mg (4 mg Intravenous Given 05/09/22 1638)  sodium  chloride 0.9 % bolus 2,000 mL (2,000 mLs Intravenous New Bag/Given 05/09/22 1639)    ED Course/ Medical Decision Making/ A&P Clinical Course as of 05/09/22 1846  Wed May 09, 2022  1616 IMPRESSION: 1. Acute appendicitis with signs of appendiceal rupture. Phlegmon in the mesoappendix, hyperenhancement of the appendiceal wall and dilation of the appendix up to 15-13 mm. No well-formed abscess currently but at risk for developing abscess. Based on diameter of appendiceal lumen the possibility of baseline appendiceal mucocele/mucinous neoplasm would be difficult to exclude. Again, the patient does show signs of perforated acute appendicitis. 2. No free air. 3. Mild inflammation along the mesenteric margin of the sigmoid in the setting of diverticular changes may be secondary and disseminated from the RIGHT pelvis. The possibility of mild concomitant  acute diverticulitis is considered. 4. Cholelithiasis with nearly 1 cm gallstone in the neck of the gallbladder. 5. Aortic atherosclerotic changes. 6. Aortic atherosclerosis.  [WF]  90 Was started on cipro Monday (by pcp) [WF]  96 Discussed with Dr. Donne Hazel of general surgery who will see pt  [WF]    Clinical Course User Index [WF] Tedd Sias, PA                             Medical Decision Making Risk Prescription drug management. Decision regarding hospitalization.   This patient presents to the ED for concern of abd pain, this involves a number of treatment options, and is a complaint that carries with it a high risk of complications and morbidity. A differential diagnosis was considered for the patient's symptoms which is discussed below:   The causes of generalized abdominal pain include but are not limited to AAA, mesenteric ischemia, appendicitis, diverticulitis, DKA, gastritis, gastroenteritis, AMI, nephrolithiasis, pancreatitis, peritonitis, adrenal insufficiency,lead poisoning, iron toxicity, intestinal ischemia, constipation, UTI,SBO/LBO, splenic rupture, biliary disease, IBD, IBS, PUD, or hepatitis. Ectopic pregnancy, ovarian torsion, PID.    Co morbidities: Discussed in HPI   Brief History:  Patient is a 57 year old female with past medical history significant for hypothyroidism, hypertension, reflux, hyperlipidemia, obesity, past surgical history includes C-section and cytectomy.   Patient presents emergency room today with complaints of right lower quadrant abdominal pain.  It is severe constant and does not seem to be improved by anything.  Worse with movement and bumps in the road.  She endorses fevers at home.  Nausea without vomiting.  She states that she has had symptoms since 11 days ago and have been progressively worsening.  On Monday she was started on ciprofloxacin by her PCP and had an outpatient CT scan ordered which was done today and showed  acute appendicitis with rupture.  Last PO: 2 saltine crackers at 7am    EMR reviewed including pt PMHx, past surgical history and past visits to ER.   See HPI for more details   Lab Tests:   I ordered and independently interpreted labs. Labs notable for  Leukocytosis of 17.5 with left shift.  Normal CMP lipase and lactate within normal limits. Imaging Studies:  I personally viewed CT imaging of patient's abdomen that was done earlier today outpatient and shows acute appendicitis with rupture and phlegmon.    Cardiac Monitoring:  The patient was maintained on a cardiac monitor.  I personally viewed and interpreted the cardiac monitored which showed an underlying rhythm of: NSR EKG non-ischemic   Medicines ordered:  I ordered medication including Zosyn, morphine, Zofran, 2 L of crystalloid Reevaluation of  the patient after these medicines showed that the patient improved I have reviewed the patients home medicines and have made adjustments as needed   Critical Interventions:     Consults/Attending Physician   I requested consultation with [facility],  and discussed lab and imaging findings as well as pertinent plan - they recommend: Dr. Donne Hazel general surgery will evaluate patient and make recommendations and admit to the hospital.   Reevaluation:  After the interventions noted above I re-evaluated patient and found that they have :improved   Social Determinants of Health:      Problem List / ED Course:  Perforated appendicitis.  Treated with antibiotics and general surgery admitting.   Dispostion:  After consideration of the diagnostic results and the patients response to treatment, I feel that the patent would benefit from admission.     Final Clinical Impression(s) / ED Diagnoses Final diagnoses:  Ruptured appendicitis    Rx / DC Orders ED Discharge Orders     None         Tedd Sias, [place] 05/09/22 1847    Wyvonnia Dusky, MD 05/10/22 1511

## 2022-05-09 NOTE — ED Provider Triage Note (Signed)
Emergency Medicine Provider Triage Evaluation Note  Kimana Week , a 57 y.o. female  was evaluated in triage.  Pt complains of right lower quadrant pain since 04/28/2022.  Had a CT scan done today and was told to come to the emergency department due to findings on CT scan.  Has associated nausea, vomiting, diarrhea.  Review of Systems  Positive:  Negative:   Physical Exam  BP (!) 115/91   Pulse 96   Temp 98.2 F (36.8 C)   Resp 20   LMP 01/16/2019   SpO2 98%  Gen:   Awake, no distress   Resp:  Normal effort  MSK:   Moves extremities without difficulty  Other:  TTP noted to RLQ  Medical Decision Making  Medically screening exam initiated at 3:58 PM.  Appropriate orders placed.  Epifania Cucinotta was informed that the remainder of the evaluation will be completed by another provider, this initial triage assessment does not replace that evaluation, and the importance of remaining in the ED until their evaluation is complete.  3:58 PM - Discussed with RN that patient is in need of a room immediately due to acute appendicitis with rupture. RN aware and working on room placement.    Keelen Quevedo A, PA-C 05/09/22 1558

## 2022-05-10 LAB — BASIC METABOLIC PANEL
Anion gap: 11 (ref 5–15)
BUN: 5 mg/dL — ABNORMAL LOW (ref 6–20)
CO2: 25 mmol/L (ref 22–32)
Calcium: 8 mg/dL — ABNORMAL LOW (ref 8.9–10.3)
Chloride: 100 mmol/L (ref 98–111)
Creatinine, Ser: 0.81 mg/dL (ref 0.44–1.00)
GFR, Estimated: >60 mL/min (ref >60)
Glucose, Bld: 94 mg/dL (ref 70–99)
Potassium: 3.6 mmol/L (ref 3.5–5.1)
Sodium: 136 mmol/L (ref 135–145)

## 2022-05-10 LAB — CBC
HCT: 33.3 % — ABNORMAL LOW (ref 36.0–46.0)
Hemoglobin: 10.9 g/dL — ABNORMAL LOW (ref 12.0–15.0)
MCH: 31.5 pg (ref 26.0–34.0)
MCHC: 32.7 g/dL (ref 30.0–36.0)
MCV: 96.2 fL (ref 80.0–100.0)
Platelets: 292 10*3/uL (ref 150–400)
RBC: 3.46 MIL/uL — ABNORMAL LOW (ref 3.87–5.11)
RDW: 12.3 % (ref 11.5–15.5)
WBC: 19.3 10*3/uL — ABNORMAL HIGH (ref 4.0–10.5)
nRBC: 0 % (ref 0.0–0.2)

## 2022-05-10 NOTE — TOC CM/SW Note (Signed)
  Transition of Care [facility]) Screening Note   Patient Details  Name: Morgane Joerger Date of Birth: [birth year]   Transition of Care [facility] CM/SW Contact:    Lennart Pall, LCSW Phone Number: 05/10/2022, 12:59 PM    Transition of Care Department [facility]) has reviewed patient and no TOC needs have been identified at this time. We will continue to monitor patient advancement through interdisciplinary progression rounds. If new patient transition needs arise, please place a TOC consult.

## 2022-05-10 NOTE — Plan of Care (Signed)

## 2022-05-10 NOTE — Progress Notes (Signed)
Progress Note     Subjective: Abdominal pain is mildly improved this morning, she states she is having less cramping.  Tolerating clear liquids without nausea, vomiting.  Objective: Vital signs in last 24 hours: Temp:  [98.2 F (36.8 C)-99.2 F (37.3 C)] 99.1 F (37.3 C) (03/14 0633) Pulse Rate:  [68-103] 81 (03/14 0633) Resp:  [12-21] 15 (03/14 [id number]) BP: (107-130)/(51-91) 121/63 (03/14 [id number]) SpO2:  [91 %-100 %] 91 % (03/14 [id number]) Weight:  [123.6 kg] 123.6 kg (03/13 2039) Last BM Date : 05/07/22  Intake/Output from previous day: 03/13 0701 - 03/14 0700 In: 935.4 [P.O.:360; I.V.:525.3; IV Piggyback:50] Out: 800 [Urine:800] Intake/Output this shift: No intake/output data recorded.  PE: General: pleasant, WD, female who is laying in bed in NAD Lungs: Respiratory effort nonlabored Abd: soft, ND, mild tenderness to palpation focally in the right lower quadrant without rebound or guarding MSK: all 4 extremities are symmetrical with no cyanosis, clubbing, or edema. Skin: warm and dry  Psych: A&Ox3 with an appropriate affect.    Lab Results:  Recent Labs    05/09/22 1622 05/10/22 0331  WBC 17.5* 19.3*  HGB 12.9 10.9*  HCT 39.1 33.3*  PLT 331 292   BMET Recent Labs    05/09/22 1622 05/10/22 0331  NA 136 136  K 3.6 3.6  CL 100 100  CO2 26 25  GLUCOSE 93 94  BUN 6 5*  CREATININE 0.81 0.81  CALCIUM 8.5* 8.0*   PT/INR No results for input(s): "LABPROT", "INR" in the last 72 hours. CMP     Component Value Date/Time   NA 136 05/10/2022 0331   K 3.6 05/10/2022 0331   CL 100 05/10/2022 0331   CO2 25 05/10/2022 0331   GLUCOSE 94 05/10/2022 0331   BUN 5 (L) 05/10/2022 0331   CREATININE 0.81 05/10/2022 0331   CALCIUM 8.0 (L) 05/10/2022 0331   PROT 7.5 05/09/2022 1622   ALBUMIN 3.2 (L) 05/09/2022 1622   AST 19 05/09/2022 1622   ALT 22 05/09/2022 1622   ALKPHOS 56 05/09/2022 1622   BILITOT 1.0 05/09/2022 1622   GFRNONAA >60 05/10/2022 0331   GFRAA >60  09/19/2016 0603   Lipase     Component Value Date/Time   LIPASE 33 05/09/2022 1622       Studies/Results: CT ABDOMEN PELVIS W CONTRAST  Result Date: 05/09/2022 CLINICAL DATA:  57 year old female presenting for evaluation of RIGHT lower quadrant pain and a abdominal pain with diarrhea and constipation intermittently for 10 days. EXAM: CT ABDOMEN AND PELVIS WITH CONTRAST TECHNIQUE: Multidetector CT imaging of the abdomen and pelvis was performed using the standard protocol following bolus administration of intravenous contrast. RADIATION DOSE REDUCTION: This exam was performed according to the departmental dose-optimization program which includes automated exposure control, adjustment of the mA and/or kV according to patient size and/or use of iterative reconstruction technique. CONTRAST:  152m ISOVUE-300 IOPAMIDOL (ISOVUE-300) INJECTION 61% COMPARISON:  None available. FINDINGS: Lower chest: Incidental imaging of the lung bases without effusion or consolidation. No chest wall abnormality. Hepatobiliary: No focal, suspicious hepatic lesion. No pericholecystic stranding. No biliary duct dilation. Portal vein is patent. Cholelithiasis with nearly 1 cm gallstone in the neck of the gallbladder. Pancreas: Mild fatty atrophy of the pancreas without ductal dilation or sign of inflammation. Spleen: Normal. Adrenals/Urinary Tract: Adrenal glands are unremarkable. Symmetric renal enhancement. No sign of hydronephrosis. No suspicious renal lesion or perinephric stranding. Urinary bladder is grossly unremarkable. Some stranding adjacent to the urinary bladder arises from perforated  appendix, see above. Stomach/Bowel: Stomach under distended. No signs of small bowel dilation or acute small bowel process aside from secondary inflammation arising from inflammatory changes in the setting of appendiceal rupture due to acute appendicitis. Phlegmon in the mesoappendix, hyperenhancement of the appendiceal wall and dilation  of the appendix up to 13 mm. Base of the appendix however with dilation up to 15 mm. No well-formed abscess. Diverticular changes, query concomitant mild inflammation along the LEFT colon distant from the appendix versus is more disseminated inflammation along the mesenteric margin of the descending colon. Vascular/Lymphatic: Scattered small lymph nodes throughout the retroperitoneum. None with pathologic enlargement. Aortic atherosclerotic changes without aneurysmal dilation. Reproductive: Intense inflammation in the RIGHT adnexa in the setting of perforated appendicitis. No well-formed abscess. Phlegmon in the mesoappendix along the mesenteric border of the appendix measuring 3.9 x 1.9 cm in the coronal plane. No ascites elsewhere or signs of free air. Other: As above Musculoskeletal: No acute or significant osseous findings. IMPRESSION: 1. Acute appendicitis with signs of appendiceal rupture. Phlegmon in the mesoappendix, hyperenhancement of the appendiceal wall and dilation of the appendix up to 15-13 mm. No well-formed abscess currently but at risk for developing abscess. Based on diameter of appendiceal lumen the possibility of baseline appendiceal mucocele/mucinous neoplasm would be difficult to exclude. Again, the patient does show signs of perforated acute appendicitis. 2. No free air. 3. Mild inflammation along the mesenteric margin of the sigmoid in the setting of diverticular changes may be secondary and disseminated from the RIGHT pelvis. The possibility of mild concomitant acute diverticulitis is considered. 4. Cholelithiasis with nearly 1 cm gallstone in the neck of the gallbladder. 5. Aortic atherosclerotic changes. 6. Aortic atherosclerosis. Aortic Atherosclerosis (ICD10-I70.0). These results were called by telephone at the time of interpretation on 05/09/2022 at 2:58 pm to provider Shaune Pollack ( NP ) , who verbally acknowledged these results. Electronically Signed   By: Zetta Bills M.D.   On:  05/09/2022 14:58    Anti-infectives: Anti-infectives (From admission, onward)    Start     Dose/Rate Route Frequency Ordered Stop   05/09/22 2330  piperacillin-tazobactam (ZOSYN) IVPB 3.375 g        3.375 g 12.5 mL/hr over 240 Minutes Intravenous Every 8 hours 05/09/22 1825     05/09/22 1630  piperacillin-tazobactam (ZOSYN) IVPB 3.375 g        3.375 g 100 mL/hr over 30 Minutes Intravenous  Once 05/09/22 1619 05/09/22 1715        Assessment/Plan Perforated appendicitis - WBC up to 19.3 from 17.5 - continue IV abx and cont to trend - may need repeat imaging +/- perc drain vs surgery pending clinical improvement - ultimately if resolves on abx she will need repeat colonoscopy and surgical follow up for discussion of interval appendectomy -Continue clear liquids today  FEN: CLD, IVF @ 192m/hr ID: [email or web address] VTE: lovenox  I reviewed last 24 h vitals and pain scores, last 48 h intake and output, last 24 h labs and trends, and last 24 h imaging results.     LOS: 1 day   [facility] 05/10/2022, 7:47 AM Please see Amion for pager number during day hours 7:00am-4:30pm

## 2022-05-11 LAB — BASIC METABOLIC PANEL
Anion gap: 8 (ref 5–15)
BUN: <5 mg/dL — ABNORMAL LOW (ref 6–20)
CO2: 25 mmol/L (ref 22–32)
Calcium: 7.8 mg/dL — ABNORMAL LOW (ref 8.9–10.3)
Chloride: 101 mmol/L (ref 98–111)
Creatinine, Ser: 0.94 mg/dL (ref 0.44–1.00)
GFR, Estimated: >60 mL/min (ref >60)
Glucose, Bld: 123 mg/dL — ABNORMAL HIGH (ref 70–99)
Potassium: 3.4 mmol/L — ABNORMAL LOW (ref 3.5–5.1)
Sodium: 134 mmol/L — ABNORMAL LOW (ref 135–145)

## 2022-05-11 LAB — CBC
HCT: 36.1 % (ref 36.0–46.0)
Hemoglobin: 11.5 g/dL — ABNORMAL LOW (ref 12.0–15.0)
MCH: 31.3 pg (ref 26.0–34.0)
MCHC: 31.9 g/dL (ref 30.0–36.0)
MCV: 98.4 fL (ref 80.0–100.0)
Platelets: 339 10*3/uL (ref 150–400)
RBC: 3.67 MIL/uL — ABNORMAL LOW (ref 3.87–5.11)
RDW: 12.2 % (ref 11.5–15.5)
WBC: 18.2 10*3/uL — ABNORMAL HIGH (ref 4.0–10.5)
nRBC: 0 % (ref 0.0–0.2)

## 2022-05-11 MED ORDER — SODIUM CHLORIDE 0.9 % IV SOLN
12.5000 mg | Freq: Four times a day (QID) | INTRAVENOUS | Status: DC | PRN
  Administered 2022-05-11 – 2022-05-15 (×9): 12.5 mg via INTRAVENOUS
  Filled 2022-05-11 (×6): qty 12.5
  Filled 2022-05-11: qty 0.5
  Filled 2022-05-11: qty 12.5
  Filled 2022-05-11: qty 0.5
  Filled 2022-05-11: qty 12.5

## 2022-05-11 NOTE — Plan of Care (Signed)
  Problem: Clinical Measurements: Goal: Ability to maintain clinical measurements within normal limits will improve Outcome: Progressing   Problem: Pain Managment: Goal: General experience of comfort will improve Outcome: Progressing   Problem: Safety: Goal: Ability to remain free from injury will improve Outcome: Progressing   

## 2022-05-11 NOTE — Progress Notes (Signed)
     Progress Note   Date: 05/11/2022  Patient Name: Jody Schultz        MRN#: [id number]  Received the following message:  General Surgery   Obesity is documented within the H&P dated 05/09/22.  Please further specify obesity.   Morbid obesity   Other  (please specify)   Supporting Information: Admitted with perforated appendicitis Hx of HTN, GERD, Obesity    BMI  48.27 kg/m Abnormal  5\' 3"  (160 cm)  as of 05/09/2022 123.6 kg  as of 05/09/2022   ED provider note 05/09/22: ".Marland KitchenMarland KitchenPatient is a 57 year old female with past medical history...obesity"   H&P: "... Appearance: She is well-developed. She is obese..."    Diet: Clear Liquid    BMI-based Classifications: UpToDate - Obesity in adults: Prevalence, screening, and evaluation (last updated: Jun 23, 2019)   BMI <18.5 - Underweight BMI = 18.5 - 24.9 - Normal weight BMI = 25 - 29.9 - Overweight BMI = 30 - 39.9 - Obesity  BMI = 40 - Severe/morbid obesity   Please exercise your independent, professional judgment when responding.  A specific answer is not anticipated or expected.     Thank You,  Enos Fling, RN, BSN, [place] [phone]  Chart says BMI calculated at 48.27.  By above criteria, diagnosis would be "severe/morbid obesity".  Armandina Gemma, MD [facility] [place] [facility] Office: [phone]

## 2022-05-11 NOTE — Plan of Care (Signed)

## 2022-05-11 NOTE — Progress Notes (Addendum)
Progress Note     Subjective: She continues to feel better (abdominal pain is better) and is tolerating clear liquids without worsening abdominal pain, n/v. Has been oob and ambulated. On supp O2 this am but does not feel short of breath. She has noticed a new slight cough. She snores at baseline.  Objective: Vital signs in last 24 hours: Temp:  [98.3 F (36.8 C)-101.4 F (38.6 C)] 101.4 F (38.6 C) (03/15 0600) Pulse Rate:  [67-94] 76 (03/15 0831) Resp:  [16-18] 18 (03/15 0600) BP: (107-119)/(53-94) 119/94 (03/15 0831) SpO2:  [89 %-94 %] 93 % (03/15 0600) Last BM Date : 05/07/22  Intake/Output from previous day: 03/14 0701 - 03/15 0700 In: 3018.9 [P.O.:550; I.V.:2318.9; IV Piggyback:150] Out: -  Intake/Output this shift: No intake/output data recorded.  PE: General: pleasant, WD, female who is laying in bed in NAD Lungs: Respiratory effort nonlabored on 2 lpm supp O2 Abd: soft, ND, mild tenderness to palpation focally in the right lower quadrant without rebound or guarding - improved from my exam yesterday MSK: all 4 extremities are symmetrical with no cyanosis, clubbing, or edema. Skin: warm and dry  Psych: A&Ox3 with an appropriate affect.    Lab Results:  Recent Labs    05/10/22 0331 05/11/22 0404  WBC 19.3* 18.2*  HGB 10.9* 11.5*  HCT 33.3* 36.1  PLT 292 339    BMET Recent Labs    05/10/22 0331 05/11/22 0404  NA 136 134*  K 3.6 3.4*  CL 100 101  CO2 25 25  GLUCOSE 94 123*  BUN 5* <5*  CREATININE 0.81 0.94  CALCIUM 8.0* 7.8*    PT/INR No results for input(s): "LABPROT", "INR" in the last 72 hours. CMP     Component Value Date/Time   NA 134 (L) 05/11/2022 0404   K 3.4 (L) 05/11/2022 0404   CL 101 05/11/2022 0404   CO2 25 05/11/2022 0404   GLUCOSE 123 (H) 05/11/2022 0404   BUN <5 (L) 05/11/2022 0404   CREATININE 0.94 05/11/2022 0404   CALCIUM 7.8 (L) 05/11/2022 0404   PROT 7.5 05/09/2022 1622   ALBUMIN 3.2 (L) 05/09/2022 1622   AST 19  05/09/2022 1622   ALT 22 05/09/2022 1622   ALKPHOS 56 05/09/2022 1622   BILITOT 1.0 05/09/2022 1622   GFRNONAA >60 05/11/2022 0404   GFRAA >60 09/19/2016 0603   Lipase     Component Value Date/Time   LIPASE 33 05/09/2022 1622       Studies/Results: CT ABDOMEN PELVIS W CONTRAST  Result Date: 05/09/2022 CLINICAL DATA:  57 year old female presenting for evaluation of RIGHT lower quadrant pain and a abdominal pain with diarrhea and constipation intermittently for 10 days. EXAM: CT ABDOMEN AND PELVIS WITH CONTRAST TECHNIQUE: Multidetector CT imaging of the abdomen and pelvis was performed using the standard protocol following bolus administration of intravenous contrast. RADIATION DOSE REDUCTION: This exam was performed according to the departmental dose-optimization program which includes automated exposure control, adjustment of the mA and/or kV according to patient size and/or use of iterative reconstruction technique. CONTRAST:  141mL ISOVUE-300 IOPAMIDOL (ISOVUE-300) INJECTION 61% COMPARISON:  None available. FINDINGS: Lower chest: Incidental imaging of the lung bases without effusion or consolidation. No chest wall abnormality. Hepatobiliary: No focal, suspicious hepatic lesion. No pericholecystic stranding. No biliary duct dilation. Portal vein is patent. Cholelithiasis with nearly 1 cm gallstone in the neck of the gallbladder. Pancreas: Mild fatty atrophy of the pancreas without ductal dilation or sign of inflammation. Spleen: Normal. Adrenals/Urinary  Tract: Adrenal glands are unremarkable. Symmetric renal enhancement. No sign of hydronephrosis. No suspicious renal lesion or perinephric stranding. Urinary bladder is grossly unremarkable. Some stranding adjacent to the urinary bladder arises from perforated appendix, see above. Stomach/Bowel: Stomach under distended. No signs of small bowel dilation or acute small bowel process aside from secondary inflammation arising from inflammatory changes  in the setting of appendiceal rupture due to acute appendicitis. Phlegmon in the mesoappendix, hyperenhancement of the appendiceal wall and dilation of the appendix up to 13 mm. Base of the appendix however with dilation up to 15 mm. No well-formed abscess. Diverticular changes, query concomitant mild inflammation along the LEFT colon distant from the appendix versus is more disseminated inflammation along the mesenteric margin of the descending colon. Vascular/Lymphatic: Scattered small lymph nodes throughout the retroperitoneum. None with pathologic enlargement. Aortic atherosclerotic changes without aneurysmal dilation. Reproductive: Intense inflammation in the RIGHT adnexa in the setting of perforated appendicitis. No well-formed abscess. Phlegmon in the mesoappendix along the mesenteric border of the appendix measuring 3.9 x 1.9 cm in the coronal plane. No ascites elsewhere or signs of free air. Other: As above Musculoskeletal: No acute or significant osseous findings. IMPRESSION: 1. Acute appendicitis with signs of appendiceal rupture. Phlegmon in the mesoappendix, hyperenhancement of the appendiceal wall and dilation of the appendix up to 15-13 mm. No well-formed abscess currently but at risk for developing abscess. Based on diameter of appendiceal lumen the possibility of baseline appendiceal mucocele/mucinous neoplasm would be difficult to exclude. Again, the patient does show signs of perforated acute appendicitis. 2. No free air. 3. Mild inflammation along the mesenteric margin of the sigmoid in the setting of diverticular changes may be secondary and disseminated from the RIGHT pelvis. The possibility of mild concomitant acute diverticulitis is considered. 4. Cholelithiasis with nearly 1 cm gallstone in the neck of the gallbladder. 5. Aortic atherosclerotic changes. 6. Aortic atherosclerosis. Aortic Atherosclerosis (ICD10-I70.0). These results were called by telephone at the time of interpretation on  05/09/2022 at 2:58 pm to provider Shaune Pollack ( NP ) , who verbally acknowledged these results. Electronically Signed   By: Zetta Bills M.D.   On: 05/09/2022 14:58    Anti-infectives: Anti-infectives (From admission, onward)    Start     Dose/Rate Route Frequency Ordered Stop   05/09/22 2330  piperacillin-tazobactam (ZOSYN) IVPB 3.375 g        3.375 g 12.5 mL/hr over 240 Minutes Intravenous Every 8 hours 05/09/22 1825     05/09/22 1630  piperacillin-tazobactam (ZOSYN) IVPB 3.375 g        3.375 g 100 mL/hr over 30 Minutes Intravenous  Once 05/09/22 1619 05/09/22 1715        Assessment/Plan Perforated appendicitis - WBC 18.2 (19.3), tmax/24h 101.27F - continue IV abx and cont to trend - may need repeat imaging +/- perc drain vs surgery pending clinical improvement. Subjectively she is feeling better. - ultimately if resolves on abx she will need repeat colonoscopy and surgical follow up for discussion of interval appendectomy -advance to full liquids today - spo2 down to 89 overnight, she does not feel short of breath. Snores at baseline. Wean o2 today and monitor. If she continues to need O2 should consider PCP follow up for sleep study which I discussed with her today  FEN: FLD, dec IVF ID: [email or web address] VTE: lovenox  Morbid obesity HTN GERD hypothyroidism  I reviewed last 24 h vitals and pain scores, last 48 h intake and output, last 24 h labs and  trends, and last 24 h imaging results.     LOS: 2 days   [facility] 05/11/2022, 9:41 AM Please see Amion for pager number during day hours 7:00am-4:30pm

## 2022-05-11 NOTE — Plan of Care (Signed)

## 2022-05-12 LAB — CBC
HCT: 33 % — ABNORMAL LOW (ref 36.0–46.0)
Hemoglobin: 10.4 g/dL — ABNORMAL LOW (ref 12.0–15.0)
MCH: 31 pg (ref 26.0–34.0)
MCHC: 31.5 g/dL (ref 30.0–36.0)
MCV: 98.5 fL (ref 80.0–100.0)
Platelets: 319 10*3/uL (ref 150–400)
RBC: 3.35 MIL/uL — ABNORMAL LOW (ref 3.87–5.11)
RDW: 12.1 % (ref 11.5–15.5)
WBC: 13.9 10*3/uL — ABNORMAL HIGH (ref 4.0–10.5)
nRBC: 0 % (ref 0.0–0.2)

## 2022-05-12 NOTE — Plan of Care (Signed)

## 2022-05-12 NOTE — Progress Notes (Signed)
Progress Note     Subjective: She continues to feel better (abdominal pain is better) and is tolerating clear liquids without worsening abdominal pain, n/v. Has been oob and ambulated.  Objective: Vital signs in last 24 hours: Temp:  [97.6 F (36.4 C)-100.7 F (38.2 C)] 97.9 F (36.6 C) (03/16 0550) Pulse Rate:  [65-73] 65 (03/16 0831) Resp:  [16-18] 16 (03/16 0550) BP: (102-124)/(52-67) 106/52 (03/16 0831) SpO2:  [88 %-99 %] 98 % (03/16 0831) Last BM Date : 05/07/22  Intake/Output from previous day: 03/15 0701 - 03/16 0700 In: 2815.4 [P.O.:1060; I.V.:1555.3; IV Piggyback:200] Out: 0  Intake/Output this shift: No intake/output data recorded.  PE: General: pleasant, WD, female who is laying in bed in NAD Lungs: Respiratory effort nonlabored on RA Abd: soft, ND MSK: all 4 extremities are symmetrical with no cyanosis, clubbing, or edema. Skin: warm and dry  Psych: A&Ox3 with an appropriate affect.    Lab Results:  Recent Labs    05/11/22 0404 05/12/22 0334  WBC 18.2* 13.9*  HGB 11.5* 10.4*  HCT 36.1 33.0*  PLT 339 319    BMET Recent Labs    05/10/22 0331 05/11/22 0404  NA 136 134*  K 3.6 3.4*  CL 100 101  CO2 25 25  GLUCOSE 94 123*  BUN 5* <5*  CREATININE 0.81 0.94  CALCIUM 8.0* 7.8*    PT/INR No results for input(s): "LABPROT", "INR" in the last 72 hours. CMP     Component Value Date/Time   NA 134 (L) 05/11/2022 0404   K 3.4 (L) 05/11/2022 0404   CL 101 05/11/2022 0404   CO2 25 05/11/2022 0404   GLUCOSE 123 (H) 05/11/2022 0404   BUN <5 (L) 05/11/2022 0404   CREATININE 0.94 05/11/2022 0404   CALCIUM 7.8 (L) 05/11/2022 0404   PROT 7.5 05/09/2022 1622   ALBUMIN 3.2 (L) 05/09/2022 1622   AST 19 05/09/2022 1622   ALT 22 05/09/2022 1622   ALKPHOS 56 05/09/2022 1622   BILITOT 1.0 05/09/2022 1622   GFRNONAA >60 05/11/2022 0404   GFRAA >60 09/19/2016 0603   Lipase     Component Value Date/Time   LIPASE 33 05/09/2022 1622        Studies/Results: No results found.  Anti-infectives: Anti-infectives (From admission, onward)    Start     Dose/Rate Route Frequency Ordered Stop   05/09/22 2330  piperacillin-tazobactam (ZOSYN) IVPB 3.375 g        3.375 g 12.5 mL/hr over 240 Minutes Intravenous Every 8 hours 05/09/22 1825     05/09/22 1630  piperacillin-tazobactam (ZOSYN) IVPB 3.375 g        3.375 g 100 mL/hr over 30 Minutes Intravenous  Once 05/09/22 1619 05/09/22 1715        Assessment/Plan Perforated appendicitis - WBC trending down, AF- continue IV abx and cont to trend - may need repeat imaging +/- perc drain vs surgery pending clinical improvement. Subjectively she is feeling better. - ultimately if resolves on abx she will need repeat colonoscopy and surgical follow up for discussion of interval appendectomy -advance to full liquids today - spo2 down to 89 overnight, she does not feel short of breath. Snores at baseline. Wean o2 today and monitor. If she continues to need O2 should consider PCP follow up for sleep study which I discussed with her today  FEN: FLD, dec IVF ID: [email or web address] VTE: lovenox  Morbid obesity HTN GERD hypothyroidism  I reviewed last 24 h vitals and pain scores, last  48 h intake and output, last 24 h labs and trends, and last 24 h imaging results.     LOS: 3 days   Rosario Adie, [facility] 05/12/2022, 10:12 AM Please see Amion for pager number during day hours 7:00am-4:30pm

## 2022-05-13 ENCOUNTER — Inpatient Hospital Stay (HOSPITAL_COMMUNITY): Payer: No Typology Code available for payment source

## 2022-05-13 LAB — CBC
HCT: 32 % — ABNORMAL LOW (ref 36.0–46.0)
Hemoglobin: 10.6 g/dL — ABNORMAL LOW (ref 12.0–15.0)
MCH: 31.8 pg (ref 26.0–34.0)
MCHC: 33.1 g/dL (ref 30.0–36.0)
MCV: 96.1 fL (ref 80.0–100.0)
Platelets: 356 10*3/uL (ref 150–400)
RBC: 3.33 MIL/uL — ABNORMAL LOW (ref 3.87–5.11)
RDW: 11.9 % (ref 11.5–15.5)
WBC: 11.3 10*3/uL — ABNORMAL HIGH (ref 4.0–10.5)
nRBC: 0 % (ref 0.0–0.2)

## 2022-05-13 MED ORDER — IOHEXOL 300 MG/ML  SOLN
100.0000 mL | Freq: Once | INTRAMUSCULAR | Status: AC | PRN
  Administered 2022-05-13: 100 mL via INTRAVENOUS

## 2022-05-13 MED ORDER — METRONIDAZOLE 500 MG/100ML IV SOLN
500.0000 mg | Freq: Two times a day (BID) | INTRAVENOUS | Status: DC
  Administered 2022-05-13 – 2022-05-16 (×7): 500 mg via INTRAVENOUS
  Filled 2022-05-13 (×7): qty 100

## 2022-05-13 MED ORDER — IOHEXOL 9 MG/ML PO SOLN
500.0000 mL | ORAL | Status: AC
  Administered 2022-05-13: 500 mL via ORAL

## 2022-05-13 MED ORDER — NYSTATIN 100000 UNIT/ML MT SUSP
5.0000 mL | Freq: Four times a day (QID) | OROMUCOSAL | Status: DC
  Administered 2022-05-13: 500000 [IU] via ORAL
  Filled 2022-05-13 (×6): qty 5

## 2022-05-13 MED ORDER — CALCIUM CARBONATE ANTACID 500 MG PO CHEW
1.0000 | CHEWABLE_TABLET | Freq: Three times a day (TID) | ORAL | Status: DC
  Administered 2022-05-13 – 2022-05-16 (×10): 200 mg via ORAL
  Filled 2022-05-13 (×10): qty 1

## 2022-05-13 MED ORDER — SODIUM CHLORIDE 0.9 % IV SOLN
2.0000 g | INTRAVENOUS | Status: DC
  Administered 2022-05-13 – 2022-05-16 (×4): 2 g via INTRAVENOUS
  Filled 2022-05-13 (×4): qty 20

## 2022-05-13 NOTE — Progress Notes (Signed)
   Subjective/Chief Complaint: Patient feels about as well today.  Complains of metallic taste in her mouth.  Feels more fatigued and has a sour stomach she states.  Abdominal pain is rated as a 2 out of 10.  She is having some loose stool.   Objective: Vital signs in last 24 hours: Temp:  [98.3 F (36.8 C)-98.7 F (37.1 C)] 98.7 F (37.1 C) (03/17 0515) Pulse Rate:  [64-71] 71 (03/17 0515) Resp:  [16-18] 18 (03/17 0515) BP: (103-132)/(52-76) 132/76 (03/17 0515) SpO2:  [98 %] 98 % (03/17 0515) Last BM Date : 05/12/22  Intake/Output from previous day: 03/16 0701 - 03/17 0700 In: 1554.4 [P.O.:600; I.V.:778.9; IV Piggyback:175.5] Out: -  Intake/Output this shift: No intake/output data recorded.   General: pleasant, WD, female who is laying in bed in NAD Lungs: Respiratory effort nonlabored on 2 lpm supp O2 Abd: soft, ND, mild tenderness to palpation focally in the right lower quadrant without rebound or guarding MSK: all 4 extremities are symmetrical with no cyanosis, clubbing, or edema. Skin: warm and dry  Psych: A&Ox3 with an appropriate affect Lab Results:  Recent Labs    05/12/22 0334 05/13/22 0321  WBC 13.9* 11.3*  HGB 10.4* 10.6*  HCT 33.0* 32.0*  PLT 319 356   BMET Recent Labs    05/11/22 0404  NA 134*  K 3.4*  CL 101  CO2 25  GLUCOSE 123*  BUN <5*  CREATININE 0.94  CALCIUM 7.8*   PT/INR No results for input(s): "LABPROT", "INR" in the last 72 hours. ABG No results for input(s): "PHART", "HCO3" in the last 72 hours.  Invalid input(s): "PCO2", "PO2"  Studies/Results: No results found.  Anti-infectives: Anti-infectives (From admission, onward)    Start     Dose/Rate Route Frequency Ordered Stop   05/13/22 0915  cefTRIAXone (ROCEPHIN) 2 g in sodium chloride 0.9 % 100 mL IVPB        2 g 200 mL/hr over 30 Minutes Intravenous Every 24 hours 05/13/22 0823     05/09/22 2330  piperacillin-tazobactam (ZOSYN) IVPB 3.375 g  Status:  Discontinued         3.375 g 12.5 mL/hr over 240 Minutes Intravenous Every 8 hours 05/09/22 1825 05/13/22 0823   05/09/22 1630  piperacillin-tazobactam (ZOSYN) IVPB 3.375 g        3.375 g 100 mL/hr over 30 Minutes Intravenous  Once 05/09/22 1619 05/09/22 1715       Assessment/Plan:    Perforated appendicitis - WBC 11,000 but feels worse today  Plan CT scan and change ABX to see if it helps sour/ metallic taste  - full liquids  -   FEN: FLD, dec IVF ID: [email or web address]  VTE: lovenox   Morbid obesity HTN GERD hypothyroidism   I reviewed last 24 h vitals and pain scores, last 48 h intake and output, last 24 h labs and trends, and last 24 h imaging results.  Joyice Faster Hajer Dwyer MD  05/13/2022 II reviewed recent lab values, vitals, and notes.  This care required straight-forward level of medical decision making.

## 2022-05-13 NOTE — Plan of Care (Signed)

## 2022-05-14 LAB — CBC
HCT: 34.8 % — ABNORMAL LOW (ref 36.0–46.0)
Hemoglobin: 11 g/dL — ABNORMAL LOW (ref 12.0–15.0)
MCH: 30.6 pg (ref 26.0–34.0)
MCHC: 31.6 g/dL (ref 30.0–36.0)
MCV: 96.7 fL (ref 80.0–100.0)
Platelets: 390 10*3/uL (ref 150–400)
RBC: 3.6 MIL/uL — ABNORMAL LOW (ref 3.87–5.11)
RDW: 12 % (ref 11.5–15.5)
WBC: 8.5 10*3/uL (ref 4.0–10.5)
nRBC: 0 % (ref 0.0–0.2)

## 2022-05-14 MED ORDER — ORAL CARE MOUTH RINSE: 15.0000 mL | OROMUCOSAL | Status: DC | PRN

## 2022-05-14 NOTE — Progress Notes (Signed)
Subjective/Chief Complaint: Patient feels somewhat worse today.  She is having some loose stool.  Wbc normal   Objective: Vital signs in last 24 hours: Temp:  [98.7 F (37.1 C)-99 F (37.2 C)] 99 F (37.2 C) (03/17 2024) Pulse Rate:  [65-67] 65 (03/17 2024) Resp:  [14-17] 14 (03/17 2024) BP: (125-154)/(72-82) 125/72 (03/17 2024) SpO2:  [97 %-98 %] 97 % (03/17 2024) Last BM Date : 05/13/22  Intake/Output from previous day: 03/17 0701 - 03/18 0700 In: 2310.7 [P.O.:240; I.V.:1517.7; IV Piggyback:553] Out: -  Intake/Output this shift: No intake/output data recorded.   General: pleasant, WD, female who is laying in bed in NAD Lungs: Respiratory effort nonlabored on 2 lpm supp O2 Abd: soft, ND, mild tenderness to palpation focally in the right lower quadrant without rebound or guarding MSK: all 4 extremities are symmetrical with no cyanosis, clubbing, or edema. Skin: warm and dry  Psych: A&Ox3 with an appropriate affect Lab Results:  Recent Labs    05/13/22 0321 05/14/22 0333  WBC 11.3* 8.5  HGB 10.6* 11.0*  HCT 32.0* 34.8*  PLT 356 390    BMET No results for input(s): "NA", "K", "CL", "CO2", "GLUCOSE", "BUN", "CREATININE", "CALCIUM" in the last 72 hours.  PT/INR No results for input(s): "LABPROT", "INR" in the last 72 hours. ABG No results for input(s): "PHART", "HCO3" in the last 72 hours.  Invalid input(s): "PCO2", "PO2"  Studies/Results: CT ABDOMEN PELVIS W CONTRAST  Result Date: 05/13/2022 CLINICAL DATA:  Right upper and lower quadrant abdominal pain. History of ruptured appendix 5 days ago. EXAM: CT ABDOMEN AND PELVIS WITH CONTRAST TECHNIQUE: Multidetector CT imaging of the abdomen and pelvis was performed using the standard protocol following bolus administration of intravenous contrast. RADIATION DOSE REDUCTION: This exam was performed according to the departmental dose-optimization program which includes automated exposure control, adjustment of the mA  and/or kV according to patient size and/or use of iterative reconstruction technique. CONTRAST:  15mL OMNIPAQUE IOHEXOL 300 MG/ML  SOLN COMPARISON:  05/09/2022. FINDINGS: Lower chest: There are small bilateral pleural effusions with atelectasis at the lung bases. Hepatobiliary: No focal liver abnormality is seen. A stone is present within the gallbladder. No biliary ductal dilatation. Pancreas: Unremarkable. No pancreatic ductal dilatation or surrounding inflammatory changes. Spleen: Normal in size without focal abnormality. Adrenals/Urinary Tract: No adrenal nodule or mass. The kidneys enhance symmetrically. No renal calculus or hydronephrosis. The bladder is unremarkable. Stomach/Bowel: Stomach is within normal limits. There is redemonstration of a distended appendix measuring 1.2 cm with a slightly thickened wall. A focal heterogeneous collection with surrounding fat stranding is noted at the appendix, likely phlegmon. No free air or pneumatosis. There is thickening of the walls of the cecum, likely related to local inflammatory changes. Scattered diverticula are noted along the colon with bowel wall thickening and surrounding fat stranding involving the distal descending colon and sigmoid colon. Vascular/Lymphatic: Aortic atherosclerosis. A few prominent lymph nodes are noted in the mesentery in the right lower quadrant, likely reactive. Reproductive: The uterus is within normal limits. There is redemonstration of inflammatory changes in the right adnexa, unchanged from the prior exam. No left adnexal mass. Other: No abdominopelvic ascites. A fat containing umbilical hernia is noted. Musculoskeletal: Degenerative changes are present in the thoracolumbar spine. No acute osseous abnormality. IMPRESSION: 1. Stable dilatation of the appendix with wall thickening with surrounding heterogeneous hypodense collection, possible phlegmon. No obvious drainable abscess or free air is seen at this time. 2. Thickening of the  walls of the  cecum with surrounding fat stranding and inflammatory changes in the right adnexa, likely related to local inflammatory changes. 3. Scattered diverticula are present along the colon with bowel wall thickening and some surrounding inflammatory changes in the distal descending colon and sigmoid colon, suggesting superimposed diverticulitis. 4. Cholelithiasis. 5. Aortic atherosclerosis. 6. Small bilateral pleural effusions with atelectasis at the lung bases. Electronically Signed   By: Brett Fairy M.D.   On: 05/13/2022 21:41    Anti-infectives: Anti-infectives (From admission, onward)    Start     Dose/Rate Route Frequency Ordered Stop   05/13/22 1000  cefTRIAXone (ROCEPHIN) 2 g in sodium chloride 0.9 % 100 mL IVPB        2 g 200 mL/hr over 30 Minutes Intravenous Every 24 hours 05/13/22 0823     05/13/22 1000  metroNIDAZOLE (FLAGYL) IVPB 500 mg        500 mg 100 mL/hr over 60 Minutes Intravenous Every 12 hours 05/13/22 0929     05/09/22 2330  piperacillin-tazobactam (ZOSYN) IVPB 3.375 g  Status:  Discontinued        3.375 g 12.5 mL/hr over 240 Minutes Intravenous Every 8 hours 05/09/22 1825 05/13/22 0823   05/09/22 1630  piperacillin-tazobactam (ZOSYN) IVPB 3.375 g        3.375 g 100 mL/hr over 30 Minutes Intravenous  Once 05/09/22 1619 05/09/22 1715       Assessment/Plan:    Perforated appendicitis - WBC wnl  CT scan shows forming phlegmon  - full liquids as tolerated -   FEN: FLD, dec IVF ID: [email or web address]  VTE: lovenox   Morbid obesity HTN GERD hypothyroidism   I reviewed last 24 h vitals and pain scores, last 48 h intake and output, last 24 h labs and trends, and last 24 h imaging results.  Rosario Adie MD  [id number] II reviewed recent lab values, vitals, and notes.  This care required straight-forward level of medical decision making.

## 2022-05-15 LAB — CBC WITH DIFFERENTIAL/PLATELET
Abs Immature Granulocytes: 0.03 10*3/uL (ref 0.00–0.07)
Basophils Absolute: 0.1 10*3/uL (ref 0.0–0.1)
Basophils Relative: 1 %
Eosinophils Absolute: 0.2 10*3/uL (ref 0.0–0.5)
Eosinophils Relative: 3 %
HCT: 39.9 % (ref 36.0–46.0)
Hemoglobin: 12.8 g/dL (ref 12.0–15.0)
Immature Granulocytes: 0 %
Lymphocytes Relative: 32 %
Lymphs Abs: 2.2 10*3/uL (ref 0.7–4.0)
MCH: 31.1 pg (ref 26.0–34.0)
MCHC: 32.1 g/dL (ref 30.0–36.0)
MCV: 97.1 fL (ref 80.0–100.0)
Monocytes Absolute: 0.4 10*3/uL (ref 0.1–1.0)
Monocytes Relative: 6 %
Neutro Abs: 3.9 10*3/uL (ref 1.7–7.7)
Neutrophils Relative %: 58 %
Platelets: 500 10*3/uL — ABNORMAL HIGH (ref 150–400)
RBC: 4.11 MIL/uL (ref 3.87–5.11)
RDW: 12.5 % (ref 11.5–15.5)
WBC: 6.8 10*3/uL (ref 4.0–10.5)
nRBC: 0 % (ref 0.0–0.2)

## 2022-05-15 LAB — BASIC METABOLIC PANEL
Anion gap: 14 (ref 5–15)
BUN: <5 mg/dL — ABNORMAL LOW (ref 6–20)
CO2: 26 mmol/L (ref 22–32)
Calcium: 8.7 mg/dL — ABNORMAL LOW (ref 8.9–10.3)
Chloride: 103 mmol/L (ref 98–111)
Creatinine, Ser: 0.72 mg/dL (ref 0.44–1.00)
GFR, Estimated: >60 mL/min (ref >60)
Glucose, Bld: 114 mg/dL — ABNORMAL HIGH (ref 70–99)
Potassium: 3.1 mmol/L — ABNORMAL LOW (ref 3.5–5.1)
Sodium: 143 mmol/L (ref 135–145)

## 2022-05-15 LAB — MAGNESIUM: Magnesium: 1.9 mg/dL (ref 1.7–2.4)

## 2022-05-15 MED ORDER — POTASSIUM CHLORIDE 20 MEQ PO PACK
40.0000 meq | PACK | Freq: Two times a day (BID) | ORAL | Status: AC
  Administered 2022-05-15 (×2): 40 meq via ORAL
  Filled 2022-05-15 (×2): qty 2

## 2022-05-15 NOTE — Progress Notes (Signed)
Subjective/Chief Complaint: Not having pain. Taking in full liquids and some saltines and thinks nausea is improved but still using antiemetics pretty frequently. Has been oob. Having loose bowel movements  Objective: Vital signs in last 24 hours: Temp:  [97.5 F (36.4 C)-98.2 F (36.8 C)] 98.2 F (36.8 C) (03/19 0537) Pulse Rate:  [54-63] 54 (03/19 0537) Resp:  [16-18] 18 (03/19 0537) BP: (130-134)/(57-84) 134/84 (03/19 0537) SpO2:  [97 %-99 %] 99 % (03/19 0537) Last BM Date : 05/14/22  Intake/Output from previous day: 03/18 0701 - 03/19 0700 In: 658.3 [I.V.:408.3; IV Piggyback:250] Out: -  Intake/Output this shift: No intake/output data recorded.   General: pleasant, WD, female who is laying in bed in NAD Lungs: Respiratory effort nonlabored on room air Abd: soft, ND, NT MSK: all 4 extremities are symmetrical with no cyanosis, clubbing, or edema. Skin: warm and dry  Psych: A&Ox3 with an appropriate affect  Lab Results:  Recent Labs    05/13/22 0321 05/14/22 0333  WBC 11.3* 8.5  HGB 10.6* 11.0*  HCT 32.0* 34.8*  PLT 356 390    BMET No results for input(s): "NA", "K", "CL", "CO2", "GLUCOSE", "BUN", "CREATININE", "CALCIUM" in the last 72 hours.  PT/INR No results for input(s): "LABPROT", "INR" in the last 72 hours. ABG No results for input(s): "PHART", "HCO3" in the last 72 hours.  Invalid input(s): "PCO2", "PO2"  Studies/Results: CT ABDOMEN PELVIS W CONTRAST  Result Date: 05/13/2022 CLINICAL DATA:  Right upper and lower quadrant abdominal pain. History of ruptured appendix 5 days ago. EXAM: CT ABDOMEN AND PELVIS WITH CONTRAST TECHNIQUE: Multidetector CT imaging of the abdomen and pelvis was performed using the standard protocol following bolus administration of intravenous contrast. RADIATION DOSE REDUCTION: This exam was performed according to the departmental dose-optimization program which includes automated exposure control, adjustment of the mA and/or  kV according to patient size and/or use of iterative reconstruction technique. CONTRAST:  145mL OMNIPAQUE IOHEXOL 300 MG/ML  SOLN COMPARISON:  05/09/2022. FINDINGS: Lower chest: There are small bilateral pleural effusions with atelectasis at the lung bases. Hepatobiliary: No focal liver abnormality is seen. A stone is present within the gallbladder. No biliary ductal dilatation. Pancreas: Unremarkable. No pancreatic ductal dilatation or surrounding inflammatory changes. Spleen: Normal in size without focal abnormality. Adrenals/Urinary Tract: No adrenal nodule or mass. The kidneys enhance symmetrically. No renal calculus or hydronephrosis. The bladder is unremarkable. Stomach/Bowel: Stomach is within normal limits. There is redemonstration of a distended appendix measuring 1.2 cm with a slightly thickened wall. A focal heterogeneous collection with surrounding fat stranding is noted at the appendix, likely phlegmon. No free air or pneumatosis. There is thickening of the walls of the cecum, likely related to local inflammatory changes. Scattered diverticula are noted along the colon with bowel wall thickening and surrounding fat stranding involving the distal descending colon and sigmoid colon. Vascular/Lymphatic: Aortic atherosclerosis. A few prominent lymph nodes are noted in the mesentery in the right lower quadrant, likely reactive. Reproductive: The uterus is within normal limits. There is redemonstration of inflammatory changes in the right adnexa, unchanged from the prior exam. No left adnexal mass. Other: No abdominopelvic ascites. A fat containing umbilical hernia is noted. Musculoskeletal: Degenerative changes are present in the thoracolumbar spine. No acute osseous abnormality. IMPRESSION: 1. Stable dilatation of the appendix with wall thickening with surrounding heterogeneous hypodense collection, possible phlegmon. No obvious drainable abscess or free air is seen at this time. 2. Thickening of the walls  of the cecum with surrounding  fat stranding and inflammatory changes in the right adnexa, likely related to local inflammatory changes. 3. Scattered diverticula are present along the colon with bowel wall thickening and some surrounding inflammatory changes in the distal descending colon and sigmoid colon, suggesting superimposed diverticulitis. 4. Cholelithiasis. 5. Aortic atherosclerosis. 6. Small bilateral pleural effusions with atelectasis at the lung bases. Electronically Signed   By: Brett Fairy M.D.   On: 05/13/2022 21:41    Anti-infectives: Anti-infectives (From admission, onward)    Start     Dose/Rate Route Frequency Ordered Stop   05/13/22 1000  cefTRIAXone (ROCEPHIN) 2 g in sodium chloride 0.9 % 100 mL IVPB        2 g 200 mL/hr over 30 Minutes Intravenous Every 24 hours 05/13/22 0823     05/13/22 1000  metroNIDAZOLE (FLAGYL) IVPB 500 mg        500 mg 100 mL/hr over 60 Minutes Intravenous Every 12 hours 05/13/22 0929     05/09/22 2330  piperacillin-tazobactam (ZOSYN) IVPB 3.375 g  Status:  Discontinued        3.375 g 12.5 mL/hr over 240 Minutes Intravenous Every 8 hours 05/09/22 1825 05/13/22 0823   05/09/22 1630  piperacillin-tazobactam (ZOSYN) IVPB 3.375 g        3.375 g 100 mL/hr over 30 Minutes Intravenous  Once 05/09/22 1619 05/09/22 1715       Assessment/Plan: Perforated appendicitis - WBC wnl yesterday. Repeat pending - CT scan 3/17 shows forming phlegmon  - advance to soft and monitor nausea -  cont abx  FEN: soft, dec IVF ID: [email or web address]  VTE: lovenox   Morbid obesity HTN GERD hypothyroidism   I reviewed last 24 h vitals and pain scores, last 48 h intake and output, last 24 h labs and trends, and last 24 h imaging results.  Winferd Humphrey, [facility] Surgery 05/15/2022, 7:38 AM Please see Amion for pager number during day hours 7:00am-4:30pm

## 2022-05-16 LAB — BASIC METABOLIC PANEL
Anion gap: 9 (ref 5–15)
BUN: <5 mg/dL — ABNORMAL LOW (ref 6–20)
CO2: 27 mmol/L (ref 22–32)
Calcium: 8.5 mg/dL — ABNORMAL LOW (ref 8.9–10.3)
Chloride: 106 mmol/L (ref 98–111)
Creatinine, Ser: 0.66 mg/dL (ref 0.44–1.00)
GFR, Estimated: >60 mL/min (ref >60)
Glucose, Bld: 101 mg/dL — ABNORMAL HIGH (ref 70–99)
Potassium: 3.6 mmol/L (ref 3.5–5.1)
Sodium: 142 mmol/L (ref 135–145)

## 2022-05-16 LAB — CBC
HCT: 36.1 % (ref 36.0–46.0)
Hemoglobin: 11.6 g/dL — ABNORMAL LOW (ref 12.0–15.0)
MCH: 31.1 pg (ref 26.0–34.0)
MCHC: 32.1 g/dL (ref 30.0–36.0)
MCV: 96.8 fL (ref 80.0–100.0)
Platelets: 469 10*3/uL — ABNORMAL HIGH (ref 150–400)
RBC: 3.73 MIL/uL — ABNORMAL LOW (ref 3.87–5.11)
RDW: 12.6 % (ref 11.5–15.5)
WBC: 6.8 10*3/uL (ref 4.0–10.5)
nRBC: 0 % (ref 0.0–0.2)

## 2022-05-16 MED ORDER — METRONIDAZOLE 500 MG PO TABS: 500.0000 mg | ORAL_TABLET | Freq: Two times a day (BID) | ORAL | 0 refills | Status: DC

## 2022-05-16 MED ORDER — ONDANSETRON 4 MG PO TBDP: 4.0000 mg | ORAL_TABLET | Freq: Four times a day (QID) | ORAL | 0 refills | Status: DC | PRN

## 2022-05-16 MED ORDER — ACETAMINOPHEN 325 MG PO TABS: 650.0000 mg | ORAL_TABLET | Freq: Four times a day (QID) | ORAL | Status: DC | PRN

## 2022-05-16 MED ORDER — CEFDINIR 300 MG PO CAPS: 300.0000 mg | ORAL_CAPSULE | Freq: Two times a day (BID) | ORAL | 0 refills | Status: AC

## 2022-05-16 NOTE — Progress Notes (Signed)
Patient was given discharge instructions, and all questions were answered.  Patient was stable for discharge and was taken to the main exit by wheelchair. 

## 2022-05-16 NOTE — Discharge Summary (Signed)
[facility] Discharge Summary   Patient ID: Jody Schultz MRN: [id number] DOB/AGE: Dec 26, 1965 57 y.o.  Admit date: 05/09/2022 Discharge date: 05/16/2022  Admitting Diagnosis: Perforated appendicitis   Discharge Diagnosis Perforated appendicitis with phlegmon   Consultants None   Imaging: No results found.  Procedures None   Hospital Course:  Patient is a 57 year old female who presented to the ED with 11 days of abdominal pain.  Workup showed acute appendicitis with perforation.  Patient was admitted and started on IV antibiotics.  Diet was advanced as tolerated. Repeat CT 05/13/22 showed stable dilated appendix with phlegmon but no drainable abscess. On 05/16/22, the patient was voiding well, tolerating diet, ambulating well, pain well controlled, vital signs stable and felt stable for discharge home on oral antibiotics.  Patient will follow up in our office in 3-4 weeks and knows to call with questions or concerns. She will call to confirm appointment date/time.    Physical Exam: General: pleasant, WD, female who is laying in bed in NAD Lungs: Respiratory effort nonlabored on room air Abd: soft, ND, NT MSK: all 4 extremities are symmetrical with no cyanosis, clubbing, or edema. Skin: warm and dry  Psych: A&Ox3 with an appropriate affect    Allergies as of 05/16/2022       Reactions   Levonorgestrel-ethinyl Estrad Other (See Comments)   Unknown         Medication List     TAKE these medications    acetaminophen 325 MG tablet Commonly known as: TYLENOL Take 2 tablets (650 mg total) by mouth every 6 (six) hours as needed for mild pain or fever (or temp > 100).   ALPRAZolam 0.5 MG tablet Commonly known as: XANAX Take 0.5 mg by mouth at bedtime.   amLODipine 5 MG tablet Commonly known as: NORVASC Take 1 tablet (5 mg total) by mouth daily.   atorvastatin 10 MG tablet Commonly known as: LIPITOR Take 10 mg by mouth at bedtime.   cefdinir 300 MG  capsule Commonly known as: OMNICEF Take 1 capsule (300 mg total) by mouth 2 (two) times daily for 7 days.   ciprofloxacin 500 MG tablet Commonly known as: CIPRO Take 500 mg by mouth 2 (two) times daily.   diclofenac Sodium 1 % Gel Commonly known as: VOLTAREN Apply 1 Application topically as needed (pain).   ibuprofen 200 MG tablet Commonly known as: ADVIL Take 400-600 mg by mouth as needed for moderate pain.   levothyroxine 125 MCG tablet Commonly known as: SYNTHROID Take 125 mcg by mouth daily.   losartan 100 MG tablet Commonly known as: COZAAR Take 100 mg by mouth at bedtime.   metroNIDAZOLE 500 MG tablet Commonly known as: Flagyl Take 1 tablet (500 mg total) by mouth 2 (two) times daily with a meal. DO NOT CONSUME ALCOHOL WHILE TAKING THIS MEDICATION.   Miconazole 3 Combo Pack 200 & 2 MG-% (9GM) Kit Generic drug: Miconazole Nitrate Applicator Place 1 suppository vaginally at bedtime.   nitroGLYCERIN 0.4 MG SL tablet Commonly known as: NITROSTAT Place 1 tablet (0.4 mg total) under the tongue every 5 (five) minutes as needed for chest pain.   ondansetron 4 MG disintegrating tablet Commonly known as: ZOFRAN-ODT Take 1 tablet (4 mg total) by mouth every 6 (six) hours as needed for nausea.   VITAMIN D PO Take 1 capsule by mouth daily.          Follow-up Information     Leighton Ruff, MD. Go on [id number].  Specialties: General Surgery, Colon and Rectal Surgery Why: 4:00 PM for follow up of perforated appendicitis, discussion of possible interval appendectomy. Please arrive 30 min prior to appointment time to check in. Contact information: [address] [address] [phone]                 Signed: Norm Parcel [facility] Surgery 05/16/2022, 3:09 PM Please see Amion for pager number during day hours 7:00am-4:30pm

## 2022-06-11 ENCOUNTER — Ambulatory Visit: Payer: Self-pay | Admitting: General Surgery

## 2022-06-11 NOTE — H&P (Signed)
PROVIDER:  Elenora Gamma, MD  MRN: [id number] DOB: 04/26/1965 DATE OF ENCOUNTER: 06/11/2022 Subjective   Chief Complaint: New Consultation (NEW PROBLEM)     History of Present Illness: Jody Schultz is a 57 y.o. female who is seen today as hospital follow-up who was admitted for approximately 1 week in March due to perforated appendicitis.  She was placed on IV antibiotics and her pain resolved.  Repeat CT scan showed stable appendix and phlegmon with no drainable abscess.  She was discharged with oral antibiotics.  She completed that course.  She is having intermittent abdominal pain and nausea but this is improving.  She is getting back to a regular diet.  She still is very fatigued.   Past Medical History:  Diagnosis Date   Allergy    Chicken pox    Depression    GERD (gastroesophageal reflux disease)    Hyperlipidemia    Hypertension    Increased frequency of headaches    Thyroid disease    Past Surgical History:  Procedure Laterality Date   CESAREAN SECTION     CYSTECTOMY     Family History  Problem Relation Age of Onset   Arthritis Mother    Stroke Father    Esophageal cancer Father    Cancer Maternal Grandmother    Cancer Paternal Grandmother    Colon cancer Neg Hx    Colon polyps Neg Hx    Stomach cancer Neg Hx    Rectal cancer Neg Hx    Social History   Socioeconomic History   Marital status: Married    Spouse name: Not on file   Number of children: Not on file   Years of education: Not on file   Highest education level: Not on file  Occupational History   Not on file  Tobacco Use   Smoking status: Former    Types: Cigarettes    Quit date: 01/27/1988    Years since quitting: 34.3   Smokeless tobacco: Never  Vaping Use   Vaping Use: Never used  Substance and Sexual Activity   Alcohol use: Yes    Alcohol/week: 1.0 standard drink of alcohol    Types: 1 Standard drinks or equivalent per week    Comment: social   Drug use: No   Sexual  activity: Not on file  Other Topics Concern   Not on file  Social History Narrative   Not on file   Social Determinants of Health   Financial Resource Strain: Not on file  Food Insecurity: No Food Insecurity (05/09/2022)   Hunger Vital Sign    Worried About Running Out of Food in the Last Year: Never true    Ran Out of Food in the Last Year: Never true  Transportation Needs: No Transportation Needs (05/09/2022)   PRAPARE - Administrator, Civil Service (Medical): No    Lack of Transportation (Non-Medical): No  Physical Activity: Not on file  Stress: Not on file  Social Connections: Not on file  Intimate Partner Violence: Not At Risk (05/09/2022)   Humiliation, Afraid, Rape, and Kick questionnaire    Fear of Current or Ex-Partner: No    Emotionally Abused: No    Physically Abused: No    Sexually Abused: No   Review of Systems - Negative except as stated above  Current Outpatient Medications:    acetaminophen (TYLENOL) 325 MG tablet, Take 2 tablets (650 mg total) by mouth every 6 (six) hours as needed for mild pain or fever (  or temp > 100)., Disp: , Rfl:    ALPRAZolam (XANAX) 0.5 MG tablet, Take 0.5 mg by mouth at bedtime., Disp: , Rfl:    amLODipine (NORVASC) 5 MG tablet, Take 1 tablet (5 mg total) by mouth daily., Disp: 30 tablet, Rfl: 0   atorvastatin (LIPITOR) 10 MG tablet, Take 10 mg by mouth at bedtime., Disp: , Rfl:    diclofenac Sodium (VOLTAREN) 1 % GEL, Apply 1 Application topically as needed (pain)., Disp: , Rfl:    ibuprofen (ADVIL) 200 MG tablet, Take 400-600 mg by mouth as needed for moderate pain., Disp: , Rfl:    levothyroxine (SYNTHROID) 125 MCG tablet, Take 125 mcg by mouth daily., Disp: , Rfl:    losartan (COZAAR) 100 MG tablet, Take 100 mg by mouth at bedtime., Disp: , Rfl:    MICONAZOLE 3 COMBO PACK 200 & 2 MG-% (9GM) KIT, Place 1 suppository vaginally at bedtime. (Patient not taking: Reported on 05/10/2022), Disp: , Rfl:    nitroGLYCERIN (NITROSTAT)  0.4 MG SL tablet, Place 1 tablet (0.4 mg total) under the tongue every 5 (five) minutes as needed for chest pain. (Patient not taking: Reported on 06/24/2019), Disp: 20 tablet, Rfl: 0   ondansetron (ZOFRAN-ODT) 4 MG disintegrating tablet, Take 1 tablet (4 mg total) by mouth every 6 (six) hours as needed for nausea., Disp: 12 tablet, Rfl: 0   VITAMIN D PO, Take 1 capsule by mouth daily., Disp: , Rfl:  Allergies  Allergen Reactions   Levonorgestrel-Ethinyl Estrad Other (See Comments)    Unknown      Objective:    Vitals:   06/11/22 1609  BP: 131/84  Pulse: 84  Temp: 36.7 C (98 F)  SpO2: 97%  Weight: (!) 125.2 kg (276 lb)  Height: 157.5 cm (5\' 2" )      General appearance - alert, well appearing, and in no distress Abdomen - soft   Labs, Imaging and Diagnostic Testing:  CT scans reviewed.  No appendicolith with dilated appendix.  Last colonoscopy report reviewed July 2021.  1 cecal polyp noted.  Assessment and Plan:  Diagnoses and all orders for this visit:  Appendicitis with perforation    57 year old female with perforated appendicitis treated nonoperatively with IV and oral antibiotics.  Her last colonoscopy was approximately 3 years ago and normal.  Given the severity of her illness, I would recommend proceeding with an interval appendectomy.  We have discussed this in detail.  I would like to wait until she is healed from this episode.  We will plan on doing this in June.  We discussed a laparoscopic approach.  We discussed the possible need for overnight stay depending on the amount of dissection performed to complete her appendectomy.  All questions were answered.  Other risks include bleeding, damage to adjacent structures and infection including anastomotic leak and wound infections.  She is also at high risk for hernia, given her BMI.    Vanita Panda, MD Colon and Rectal Surgery [facility]

## 2022-07-06 NOTE — Progress Notes (Signed)
PCP - Maryelizabeth Rowan, MD  Cardiologist - no  PPM/ICD -  Device Orders -  Rep Notified -   Chest x-ray -  EKG -  Stress Test -  ECHO -  Cardiac Cath -   Sleep Study -  CPAP -   Fasting Blood Sugar -  Checks Blood Sugar _____ times a day  Blood Thinner Instructions: Aspirin Instructions:  ERAS Protcol - PRE-SURGERY    COVID vaccine -x4  Activity--Able to climb a flight of stairs without SOB or CP Anesthesia review: HTN  Patient denies shortness of breath, fever, cough and chest pain at PAT appointment   All instructions explained to the patient, with a verbal understanding of the material. Patient agrees to go over the instructions while at home for a better understanding. Patient also instructed to self quarantine after being tested for COVID-19. The opportunity to ask questions was provided.

## 2022-07-06 NOTE — Patient Instructions (Addendum)
SURGICAL WAITING ROOM VISITATION  Patients having surgery or a procedure may have no more than 2 support people in the waiting area - these visitors may rotate.    Children under the age of 37 must have an adult with them who is not the patient.  If the patient needs to stay at the hospital during part of their recovery, the visitor guidelines for inpatient rooms apply  Pre-op nurse will coordinate an appropriate time for 1 support person to accompany patient in pre-op.  This support person may not rotate.    Please refer to the [facility] website for the visitor guidelines for Inpatients (after your surgery is over and you are in a regular room).       Your procedure is scheduled on: 08-02-22   Report to [facility] Main Entrance    Report to admitting at   0515 AM   Call this number if you have problems the morning of surgery [phone]   Do not eat food  or drink liquids :After Midnight.             If you have questions, please contact your surgeon's office.   FOLLOW  ANY ADDITIONAL PRE OP INSTRUCTIONS YOU RECEIVED FROM YOUR SURGEON'S OFFICE!!!     Oral Hygiene is also important to reduce your risk of infection.                                    Remember - BRUSH YOUR TEETH THE MORNING OF SURGERY WITH YOUR REGULAR TOOTHPASTE  DENTURES WILL BE REMOVED PRIOR TO SURGERY PLEASE DO NOT APPLY "Poly grip" OR ADHESIVES!!!   Do NOT smoke after Midnight   Take these medicines the morning of surgery with A SIP OF WATER: levothyroxine, tylenol if needed                               You may not have any metal on your body including hair pins, jewelry, and body piercing             Do not wear make-up, lotions, powders, perfumes/cologne, or deodorant  Do not wear nail polish including gel and S&S, artificial/acrylic nails, or any other type of covering on natural nails including finger and toenails. If you have artificial nails, gel coating, etc. that needs to be removed  by a nail salon please have this removed prior to surgery or surgery may need to be canceled/ delayed if the surgeon/ anesthesia feels like they are unable to be safely monitored.   Do not shave  48 hours prior to surgery.                 Do not bring valuables to the hospital. [facility] IS NOT             RESPONSIBLE   FOR VALUABLES.   Contacts, glasses, dentures or bridgework may not be worn into surgery.   Bring small overnight bag day of surgery.   DO NOT BRING YOUR HOME MEDICATIONS TO THE HOSPITAL. PHARMACY WILL DISPENSE MEDICATIONS LISTED ON YOUR MEDICATION LIST TO YOU DURING YOUR ADMISSION IN THE HOSPITAL!    Patients discharged on the day of surgery will not be allowed to drive home.  Someone NEEDS to stay with you for the first 24 hours after anesthesia.   S  Please read over the following fact sheets you were given: IF YOU HAVE QUESTIONS ABOUT YOUR PRE-OP INSTRUCTIONS PLEASE CALL [phone]   . If you test positive for Covid or have been in contact with anyone that has tested positive in the last 10 days please notify you surgeon.    [facility] - Preparing for Surgery Before surgery, you can play an important role.  Because skin is not sterile, your skin needs to be as free of germs as possible.  You can reduce the number of germs on your skin by washing with CHG (chlorahexidine gluconate) soap before surgery.  CHG is an antiseptic cleaner which kills germs and bonds with the skin to continue killing germs even after washing. Please DO NOT use if you have an allergy to CHG or antibacterial soaps.  If your skin becomes reddened/irritated stop using the CHG and inform your nurse when you arrive at Short Stay. Do not shave (including legs and underarms) for at least 48 hours prior to the first CHG shower.  You may shave your face/neck. Please follow these instructions carefully:  1.  Shower with CHG Soap the night before surgery and the  morning of Surgery.  2.   If you choose to wash your hair, wash your hair first as usual with your  normal  shampoo.  3.  After you shampoo, rinse your hair and body thoroughly to remove the  shampoo.                           4.  Use CHG as you would any other liquid soap.  You can apply chg directly  to the skin and wash                       Gently with a scrungie or clean washcloth.  5.  Apply the CHG Soap to your body ONLY FROM THE NECK DOWN.   Do not use on face/ open                           Wound or open sores. Avoid contact with eyes, ears mouth and genitals (private parts).                       Wash face,  Genitals (private parts) with your normal soap.             6.  Wash thoroughly, paying special attention to the area where your surgery  will be performed.  7.  Thoroughly rinse your body with warm water from the neck down.  8.  DO NOT shower/wash with your normal soap after using and rinsing off  the CHG Soap.                9.  Pat yourself dry with a clean towel.            10.  Wear clean pajamas.            11.  Place clean sheets on your bed the night of your first shower and do not  sleep with pets. Day of Surgery : Do not apply any lotions/deodorants the morning of surgery.  Please wear clean clothes to the hospital/surgery center.  FAILURE TO FOLLOW THESE INSTRUCTIONS MAY RESULT IN THE CANCELLATION OF YOUR SURGERY PATIENT SIGNATURE_________________________________  NURSE SIGNATURE__________________________________  ________________________________________________________________________

## 2022-07-10 ENCOUNTER — Other Ambulatory Visit: Payer: Self-pay

## 2022-07-10 ENCOUNTER — Encounter (HOSPITAL_COMMUNITY): Payer: Self-pay

## 2022-07-10 ENCOUNTER — Encounter (HOSPITAL_COMMUNITY)
Admit: 2022-07-10 | Discharge: 2022-07-10 | Disposition: A | Discharge status: Home / Self Care | Payer: No Typology Code available for payment source | Attending: General Surgery | Admitting: General Surgery

## 2022-07-10 VITALS — BP 116/75 | HR 66 | Temp 98.2°F | Resp 16 | Ht 63.0 in | Wt 276.0 lb

## 2022-07-10 DIAGNOSIS — R030 Elevated blood-pressure reading, without diagnosis of hypertension: Secondary | ICD-10-CM | POA: Insufficient documentation

## 2022-07-10 DIAGNOSIS — Z01812 Encounter for preprocedural laboratory examination: Secondary | ICD-10-CM | POA: Diagnosis not present

## 2022-07-10 HISTORY — DX: Hypothyroidism, unspecified: E03.9

## 2022-07-10 LAB — CBC
HCT: 43.4 % (ref 36.0–46.0)
Hemoglobin: 13.9 g/dL (ref 12.0–15.0)
MCH: 31.3 pg (ref 26.0–34.0)
MCHC: 32 g/dL (ref 30.0–36.0)
MCV: 97.7 fL (ref 80.0–100.0)
Platelets: 315 10*3/uL (ref 150–400)
RBC: 4.44 MIL/uL (ref 3.87–5.11)
RDW: 14.2 % (ref 11.5–15.5)
WBC: 7.4 10*3/uL (ref 4.0–10.5)
nRBC: 0 % (ref 0.0–0.2)

## 2022-07-10 LAB — BASIC METABOLIC PANEL
Anion gap: 8 (ref 5–15)
BUN: 18 mg/dL (ref 6–20)
CO2: 26 mmol/L (ref 22–32)
Calcium: 8.9 mg/dL (ref 8.9–10.3)
Chloride: 104 mmol/L (ref 98–111)
Creatinine, Ser: 0.69 mg/dL (ref 0.44–1.00)
GFR, Estimated: >60 mL/min (ref >60)
Glucose, Bld: 97 mg/dL (ref 70–99)
Potassium: 4.6 mmol/L (ref 3.5–5.1)
Sodium: 138 mmol/L (ref 135–145)

## 2022-08-02 ENCOUNTER — Encounter (HOSPITAL_COMMUNITY)
Admission: RE | Disposition: A | Discharge status: Home / Self Care | Payer: Self-pay | Source: Ambulatory Visit | Attending: General Surgery

## 2022-08-02 ENCOUNTER — Ambulatory Visit (HOSPITAL_COMMUNITY)
Admission: RE | Admit: 2022-08-02 | Discharge: 2022-08-02 | Disposition: A | Discharge status: Home / Self Care | Payer: No Typology Code available for payment source | Source: Ambulatory Visit | Attending: General Surgery | Admitting: General Surgery

## 2022-08-02 ENCOUNTER — Ambulatory Visit (HOSPITAL_BASED_OUTPATIENT_CLINIC_OR_DEPARTMENT_OTHER): Payer: No Typology Code available for payment source | Admitting: Anesthesiology

## 2022-08-02 ENCOUNTER — Other Ambulatory Visit: Payer: Self-pay

## 2022-08-02 ENCOUNTER — Encounter (HOSPITAL_COMMUNITY): Payer: Self-pay | Admitting: General Surgery

## 2022-08-02 ENCOUNTER — Ambulatory Visit (HOSPITAL_COMMUNITY): Payer: No Typology Code available for payment source | Admitting: Anesthesiology

## 2022-08-02 DIAGNOSIS — K37 Unspecified appendicitis: Secondary | ICD-10-CM | POA: Diagnosis not present

## 2022-08-02 DIAGNOSIS — K3532 Acute appendicitis with perforation and localized peritonitis, without abscess: Secondary | ICD-10-CM | POA: Diagnosis present

## 2022-08-02 DIAGNOSIS — Z87891 Personal history of nicotine dependence: Secondary | ICD-10-CM | POA: Diagnosis not present

## 2022-08-02 DIAGNOSIS — K219 Gastro-esophageal reflux disease without esophagitis: Secondary | ICD-10-CM | POA: Diagnosis not present

## 2022-08-02 DIAGNOSIS — Z09 Encounter for follow-up examination after completed treatment for conditions other than malignant neoplasm: Secondary | ICD-10-CM | POA: Diagnosis not present

## 2022-08-02 DIAGNOSIS — I10 Essential (primary) hypertension: Secondary | ICD-10-CM | POA: Insufficient documentation

## 2022-08-02 DIAGNOSIS — Z79899 Other long term (current) drug therapy: Secondary | ICD-10-CM | POA: Insufficient documentation

## 2022-08-02 DIAGNOSIS — E039 Hypothyroidism, unspecified: Secondary | ICD-10-CM

## 2022-08-02 HISTORY — PX: LAPAROSCOPIC APPENDECTOMY: SHX408

## 2022-08-02 SURGERY — APPENDECTOMY, LAPAROSCOPIC
Anesthesia: General

## 2022-08-02 MED ORDER — ONDANSETRON HCL 4 MG/2ML IJ SOLN
INTRAMUSCULAR | Status: AC
  Filled 2022-08-02: qty 2

## 2022-08-02 MED ORDER — FENTANYL CITRATE (PF) 100 MCG/2ML IJ SOLN
INTRAMUSCULAR | Status: DC | PRN
  Administered 2022-08-02 (×2): 25 ug via INTRAVENOUS
  Administered 2022-08-02: 100 ug via INTRAVENOUS
  Administered 2022-08-02: 50 ug via INTRAVENOUS

## 2022-08-02 MED ORDER — SODIUM CHLORIDE 0.9% FLUSH: 3.0000 mL | Freq: Two times a day (BID) | INTRAVENOUS | Status: DC

## 2022-08-02 MED ORDER — HYDROMORPHONE HCL 2 MG/ML IJ SOLN
INTRAMUSCULAR | Status: AC
  Filled 2022-08-02: qty 1

## 2022-08-02 MED ORDER — DEXAMETHASONE SODIUM PHOSPHATE 10 MG/ML IJ SOLN
INTRAMUSCULAR | Status: DC | PRN
  Administered 2022-08-02: 8 mg via INTRAVENOUS

## 2022-08-02 MED ORDER — ACETAMINOPHEN 10 MG/ML IV SOLN
INTRAVENOUS | Status: DC | PRN
  Administered 2022-08-02: 1000 mg via INTRAVENOUS

## 2022-08-02 MED ORDER — ACETAMINOPHEN 500 MG PO TABS: 1000.0000 mg | ORAL_TABLET | Freq: Once | ORAL | Status: DC

## 2022-08-02 MED ORDER — OXYCODONE HCL 5 MG/5ML PO SOLN: 5.0000 mg | Freq: Once | ORAL | Status: DC | PRN

## 2022-08-02 MED ORDER — ROCURONIUM BROMIDE 10 MG/ML (PF) SYRINGE
PREFILLED_SYRINGE | INTRAVENOUS | Status: AC
  Filled 2022-08-02: qty 10

## 2022-08-02 MED ORDER — OXYCODONE HCL 5 MG PO TABS: 5.0000 mg | ORAL_TABLET | Freq: Once | ORAL | Status: DC | PRN

## 2022-08-02 MED ORDER — CHLORHEXIDINE GLUCONATE 0.12 % MT SOLN
15.0000 mL | Freq: Once | OROMUCOSAL | Status: AC
  Administered 2022-08-02: 15 mL via OROMUCOSAL

## 2022-08-02 MED ORDER — EPHEDRINE SULFATE (PRESSORS) 50 MG/ML IJ SOLN
INTRAMUSCULAR | Status: DC | PRN
  Administered 2022-08-02: 10 mg via INTRAVENOUS

## 2022-08-02 MED ORDER — LACTATED RINGERS IV SOLN: INTRAVENOUS | Status: DC

## 2022-08-02 MED ORDER — MIDAZOLAM HCL 2 MG/2ML IJ SOLN
INTRAMUSCULAR | Status: AC
  Filled 2022-08-02: qty 2

## 2022-08-02 MED ORDER — 0.9 % SODIUM CHLORIDE (POUR BTL) OPTIME
TOPICAL | Status: DC | PRN
  Administered 2022-08-02: 1000 mL

## 2022-08-02 MED ORDER — ACETAMINOPHEN 10 MG/ML IV SOLN
INTRAVENOUS | Status: AC
  Filled 2022-08-02: qty 100

## 2022-08-02 MED ORDER — EPHEDRINE 5 MG/ML INJ
INTRAVENOUS | Status: AC
  Filled 2022-08-02: qty 5

## 2022-08-02 MED ORDER — ROCURONIUM BROMIDE 10 MG/ML (PF) SYRINGE
PREFILLED_SYRINGE | INTRAVENOUS | Status: DC | PRN
  Administered 2022-08-02: 70 mg via INTRAVENOUS

## 2022-08-02 MED ORDER — ONDANSETRON HCL 4 MG/2ML IJ SOLN
INTRAMUSCULAR | Status: DC | PRN
  Administered 2022-08-02: 4 mg via INTRAVENOUS

## 2022-08-02 MED ORDER — BUPIVACAINE HCL (PF) 0.5 % IJ SOLN
INTRAMUSCULAR | Status: DC | PRN
  Administered 2022-08-02: 30 mL

## 2022-08-02 MED ORDER — FENTANYL CITRATE (PF) 100 MCG/2ML IJ SOLN
INTRAMUSCULAR | Status: AC
  Filled 2022-08-02: qty 2

## 2022-08-02 MED ORDER — PROMETHAZINE HCL 25 MG/ML IJ SOLN: 6.2500 mg | INTRAMUSCULAR | Status: DC | PRN

## 2022-08-02 MED ORDER — PROPOFOL 10 MG/ML IV BOLUS
INTRAVENOUS | Status: AC
  Filled 2022-08-02: qty 20

## 2022-08-02 MED ORDER — HYDROMORPHONE HCL 1 MG/ML IJ SOLN
INTRAMUSCULAR | Status: DC | PRN
  Administered 2022-08-02 (×2): .5 mg via INTRAVENOUS

## 2022-08-02 MED ORDER — BUPIVACAINE HCL (PF) 0.5 % IJ SOLN
INTRAMUSCULAR | Status: AC
  Filled 2022-08-02: qty 30

## 2022-08-02 MED ORDER — PHENYLEPHRINE 80 MCG/ML (10ML) SYRINGE FOR IV PUSH (FOR BLOOD PRESSURE SUPPORT)
PREFILLED_SYRINGE | INTRAVENOUS | Status: DC | PRN
  Administered 2022-08-02: 240 ug via INTRAVENOUS

## 2022-08-02 MED ORDER — FENTANYL CITRATE PF 50 MCG/ML IJ SOSY: 25.0000 ug | PREFILLED_SYRINGE | INTRAMUSCULAR | Status: DC | PRN

## 2022-08-02 MED ORDER — SCOPOLAMINE 1 MG/3DAYS TD PT72
1.0000 | MEDICATED_PATCH | Freq: Once | TRANSDERMAL | Status: AC
  Administered 2022-08-02: 1 via TRANSDERMAL
  Filled 2022-08-02: qty 1

## 2022-08-02 MED ORDER — BUPIVACAINE LIPOSOME 1.3 % IJ SUSP: 20.0000 mL | Freq: Once | INTRAMUSCULAR | Status: DC

## 2022-08-02 MED ORDER — SUGAMMADEX SODIUM 200 MG/2ML IV SOLN
INTRAVENOUS | Status: DC | PRN
  Administered 2022-08-02: 250 mg via INTRAVENOUS

## 2022-08-02 MED ORDER — ACETAMINOPHEN 325 MG PO TABS: 325.0000 mg | ORAL_TABLET | ORAL | Status: DC | PRN

## 2022-08-02 MED ORDER — KETOROLAC TROMETHAMINE 30 MG/ML IJ SOLN
INTRAMUSCULAR | Status: DC | PRN
  Administered 2022-08-02: 30 mg via INTRAVENOUS

## 2022-08-02 MED ORDER — LIDOCAINE HCL (CARDIAC) PF 100 MG/5ML IV SOSY
PREFILLED_SYRINGE | INTRAVENOUS | Status: DC | PRN
  Administered 2022-08-02: 40 mg via INTRATRACHEAL

## 2022-08-02 MED ORDER — ACETAMINOPHEN 160 MG/5ML PO SOLN: 325.0000 mg | ORAL | Status: DC | PRN

## 2022-08-02 MED ORDER — ACETAMINOPHEN 10 MG/ML IV SOLN: 1000.0000 mg | Freq: Once | INTRAVENOUS | Status: DC | PRN

## 2022-08-02 MED ORDER — ORAL CARE MOUTH RINSE: 15.0000 mL | Freq: Once | OROMUCOSAL | Status: AC

## 2022-08-02 MED ORDER — MIDAZOLAM HCL 2 MG/2ML IJ SOLN
INTRAMUSCULAR | Status: DC | PRN
  Administered 2022-08-02: 2 mg via INTRAVENOUS

## 2022-08-02 MED ORDER — TRAMADOL HCL 50 MG PO TABS: 50.0000 mg | ORAL_TABLET | Freq: Four times a day (QID) | ORAL | 0 refills | Status: AC | PRN

## 2022-08-02 MED ORDER — DEXAMETHASONE SODIUM PHOSPHATE 10 MG/ML IJ SOLN
INTRAMUSCULAR | Status: AC
  Filled 2022-08-02: qty 1

## 2022-08-02 MED ORDER — AMISULPRIDE (ANTIEMETIC) 5 MG/2ML IV SOLN: 10.0000 mg | Freq: Once | INTRAVENOUS | Status: DC | PRN

## 2022-08-02 MED ORDER — PROPOFOL 10 MG/ML IV BOLUS
INTRAVENOUS | Status: DC | PRN
  Administered 2022-08-02: 170 mg via INTRAVENOUS

## 2022-08-02 MED ORDER — LACTATED RINGERS IR SOLN
Status: DC | PRN
  Administered 2022-08-02: 1000 mL

## 2022-08-02 MED ORDER — SODIUM CHLORIDE 0.9 % IV SOLN
2.0000 g | INTRAVENOUS | Status: AC
  Administered 2022-08-02: 2 g via INTRAVENOUS
  Filled 2022-08-02: qty 2

## 2022-08-02 SURGICAL SUPPLY — 49 items
ADH SKN CLS APL DERMABOND .7 (GAUZE/BANDAGES/DRESSINGS) ×1
APL PRP STRL LF DISP 70% ISPRP (MISCELLANEOUS) ×1
APPLIER CLIP 5 13 M/L LIGAMAX5 (MISCELLANEOUS)
APPLIER CLIP ROT 10 11.4 M/L (STAPLE)
APR CLP MED LRG 11.4X10 (STAPLE)
APR CLP MED LRG 5 ANG JAW (MISCELLANEOUS)
BAG COUNTER SPONGE SURGICOUNT (BAG) IMPLANT
BAG SPNG CNTER NS LX DISP (BAG)
CABLE HIGH FREQUENCY MONO STRZ (ELECTRODE) IMPLANT
CHLORAPREP W/TINT 26 (MISCELLANEOUS) ×1 IMPLANT
CLIP APPLIE 5 13 M/L LIGAMAX5 (MISCELLANEOUS) IMPLANT
CLIP APPLIE ROT 10 11.4 M/L (STAPLE) IMPLANT
DERMABOND ADVANCED .7 DNX12 (GAUZE/BANDAGES/DRESSINGS) ×1 IMPLANT
DRAPE LAPAROSCOPIC ABDOMINAL (DRAPES) IMPLANT
ELECT REM PT RETURN 15FT ADLT (MISCELLANEOUS) ×1 IMPLANT
GLOVE BIO SURGEON STRL SZ 6.5 (GLOVE) ×1 IMPLANT
GLOVE BIOGEL PI IND STRL 7.0 (GLOVE) ×1 IMPLANT
GLOVE INDICATOR 6.5 STRL GRN (GLOVE) ×1 IMPLANT
GOWN STRL REUS W/ TWL XL LVL3 (GOWN DISPOSABLE) ×2 IMPLANT
GOWN STRL REUS W/TWL XL LVL3 (GOWN DISPOSABLE) ×2
GRASPER SUT TROCAR 14GX15 (MISCELLANEOUS) IMPLANT
HANDLE STAPLE EGIA 4 XL (STAPLE) IMPLANT
IRRIG SUCT STRYKERFLOW 2 WTIP (MISCELLANEOUS) ×1
IRRIGATION SUCT STRKRFLW 2 WTP (MISCELLANEOUS) ×1 IMPLANT
KIT BASIN OR (CUSTOM PROCEDURE TRAY) ×1 IMPLANT
KIT TURNOVER KIT A (KITS) IMPLANT
MARKER SKIN DUAL TIP RULER LAB (MISCELLANEOUS) IMPLANT
PENCIL SMOKE EVACUATOR (MISCELLANEOUS) IMPLANT
RELOAD EGIA 45 MED/THCK PURPLE (STAPLE) IMPLANT
RELOAD EGIA 45 TAN VASC (STAPLE) IMPLANT
RELOAD EGIA 60 MED/THCK PURPLE (STAPLE) IMPLANT
RELOAD EGIA 60 TAN VASC (STAPLE) IMPLANT
RELOAD STAPLE 60 MED/THCK ART (STAPLE) IMPLANT
SCISSORS LAP 5X35 DISP (ENDOMECHANICALS) IMPLANT
SET TUBE SMOKE EVAC HIGH FLOW (TUBING) ×1 IMPLANT
SHEARS HARMONIC ACE PLUS 36CM (ENDOMECHANICALS) IMPLANT
SLEEVE ADV FIXATION 5X100MM (TROCAR) ×1 IMPLANT
SPIKE FLUID TRANSFER (MISCELLANEOUS) ×1 IMPLANT
SUT VIC AB 2-0 SH 27 (SUTURE)
SUT VIC AB 2-0 SH 27X BRD (SUTURE) IMPLANT
SUT VIC AB 4-0 PS2 27 (SUTURE) ×1 IMPLANT
SUT VICRYL 0 UR6 27IN ABS (SUTURE) IMPLANT
SYS BAG RETRIEVAL 10MM (BASKET) ×1
SYSTEM BAG RETRIEVAL 10MM (BASKET) IMPLANT
TOWEL OR 17X26 10 PK STRL BLUE (TOWEL DISPOSABLE) ×1 IMPLANT
TRAY FOLEY MTR SLVR 16FR STAT (SET/KITS/TRAYS/PACK) ×1 IMPLANT
TRAY LAPAROSCOPIC (CUSTOM PROCEDURE TRAY) ×1 IMPLANT
TROCAR ADV FIXATION 5X100MM (TROCAR) ×1 IMPLANT
TROCAR BALLN 12MMX100 BLUNT (TROCAR) ×1 IMPLANT

## 2022-08-02 NOTE — Anesthesia Procedure Notes (Signed)
Procedure Name: Intubation Date/Time: 08/02/2022 7:47 AM  Performed by: Oletha Cruel, CRNAPre-anesthesia Checklist: Patient identified, Emergency Drugs available, Suction available, Patient being monitored and Timeout performed Patient Re-evaluated:Patient Re-evaluated prior to induction Oxygen Delivery Method: Circle system utilized Preoxygenation: Pre-oxygenation with 100% oxygen Induction Type: IV induction Ventilation: Mask ventilation without difficulty Laryngoscope Size: Mac and 4 Grade View: Grade II Tube type: Oral Tube size: 7.5 mm Number of attempts: 1 Airway Equipment and Method: Stylet Placement Confirmation: ETT inserted through vocal cords under direct vision, positive ETCO2 and breath sounds checked- equal and bilateral Secured at: 21 cm Tube secured with: Tape Dental Injury: Teeth and Oropharynx as per pre-operative assessment

## 2022-08-02 NOTE — Anesthesia Postprocedure Evaluation (Signed)
Anesthesia Post Note  Patient: Liah Starbuck  Procedure(s) Performed: APPENDECTOMY LAPAROSCOPIC INTERNAL     Patient location during evaluation: PACU Anesthesia Type: General Level of consciousness: awake and alert Pain management: pain level controlled Vital Signs Assessment: post-procedure vital signs reviewed and stable Respiratory status: spontaneous breathing, nonlabored ventilation, respiratory function stable and patient connected to nasal cannula oxygen Cardiovascular status: blood pressure returned to baseline and stable Postop Assessment: no apparent nausea or vomiting Anesthetic complications: no  No notable events documented.  Last Vitals:  Vitals:   08/02/22 0920 08/02/22 0930  BP: 97/72 95/82  Pulse: (!) 56 (!) 54  Resp: 17   Temp: (!) 36.1 C   SpO2: 94% 97%    Last Pain:  Vitals:   08/02/22 0920  TempSrc:   PainSc: 0-No pain                 Shelton Silvas

## 2022-08-02 NOTE — Op Note (Signed)
Jody Schultz [id number]   PRE-OPERATIVE DIAGNOSIS:  Remote Appendicitis  POST-OPERATIVE DIAGNOSIS:  Remote Appendicitis   Procedure(s): INTERVAL APPENDECTOMY, LAPAROSCOPIC     Surgeon(s): Romie Levee, MD  ASSISTANT: none   ANESTHESIA:   local and general  EBL:   5ml  Delay start of Pharmacological VTE agent (>24hrs) due to surgical blood loss or risk of bleeding:  no  DRAINS: none   SPECIMEN:  Source of Specimen:  appendix  DISPOSITION OF SPECIMEN:  PATHOLOGY  COUNTS:  YES  PLAN OF CARE: Discharge to home after PACU  PATIENT DISPOSITION:  PACU - hemodynamically stable.   INDICATIONS: Patient with concerning symptoms & work up suspicious for appendicitis.  Surgery was recommended:  The anatomy & physiology of the digestive tract was discussed.  The pathophysiology of appendicitis was discussed.  Natural history risks without surgery was discussed.   I feel the risks of no intervention will lead to serious problems that outweigh the operative risks; therefore, I recommended diagnostic laparoscopy with removal of appendix to remove the pathology.  Laparoscopic & open techniques were discussed.   I noted a good likelihood this will help address the problem.    Risks such as bleeding, infection, abscess, leak, reoperation, possible ostomy, hernia, heart attack, death, and other risks were discussed.  Goals of post-operative recovery were discussed as well.  We will work to minimize complications.  Questions were answered.  The patient expresses understanding & wishes to proceed with surgery.  OR FINDINGS: appendix with signs of recent inflammation adherent to abdominal sidewall  DESCRIPTION:   The patient was identified & brought into the operating room. The patient was positioned supine with left arm tucked. SCDs were active during the entire case. The patient underwent general anesthesia without any difficulty.  A foley catheter was inserted under sterile conditions.  The abdomen was prepped and draped in a sterile fashion. A Surgical Timeout confirmed our plan.   I made a vertical incision through the superior umbilical fold.  I made a nick in the supraumbilical fascia and confirmed peritoneal entry.  I placed a stay suture and then the [facility] port.  We induced carbon dioxide insufflation.  Camera inspection revealed no injury.  I placed additional ports under direct laparoscopic visualization.  I mobilized the terminal ileum to proximal ascending colon in a lateral to medial fashion.  I took care to avoid injuring any retroperitoneal structures.  The appendix was adherent to the abdominal sidewall.  This was bluntly dissected free and elevated.  I was able to free off the base of the appendix, which was viable.  I stapled the appendix off the cecum using a laparoscopic purple 60mm load stapler.  I took a healthy cuff viable cecum. I ligated the mesoappendix with a vascular load stapler.  I placed the appendix inside an EndoCatch bag and removed out the [place] port.  I did copious irrigation. Hemostasis was good in the mesoappendix, colon mesentery, and retroperitoneum. Staple line was intact on the cecum with no bleeding. I washed out the pelvis, retrohepatic space and right paracolic gutter.  Hemostasis is good. There was no perforation or injury.   I aspirated the carbon dioxide. I removed the ports. I closed the umbilical fascia site using a 0 Vicryl stitch. I closed skin using 4-0 vicryl stitch.  Sterile dressings were applied.  Patient was extubated and sent to the recovery room.  Questions answered. They expressed understanding and appreciation.   Vanita Panda, MD  Colorectal and General Surgery  [facility]

## 2022-08-02 NOTE — Transfer of Care (Signed)
Immediate Anesthesia Transfer of Care Note  Patient: Jody Schultz  Procedure(s) Performed: APPENDECTOMY LAPAROSCOPIC INTERNAL  Patient Location: PACU  Anesthesia Type:General  Level of Consciousness: awake and patient cooperative  Airway & Oxygen Therapy: Patient Spontanous Breathing and Patient connected to face mask oxygen  Post-op Assessment: Report given to RN and Post -op Vital signs reviewed and stable  Post vital signs: Reviewed and stable  Last Vitals:  Vitals Value Taken Time  BP 105/42 08/02/22 0850  Temp 36.3 C 08/02/22 0850  Pulse 65 08/02/22 0853  Resp    SpO2 100 % 08/02/22 0853  Vitals shown include unvalidated device data.  Last Pain:  Vitals:   08/02/22 0610  TempSrc: Oral  PainSc:          Complications: No notable events documented.

## 2022-08-02 NOTE — Anesthesia Preprocedure Evaluation (Addendum)
Anesthesia Evaluation  Patient identified by MRN, date of birth, ID band Patient awake    Reviewed: Allergy & Precautions, NPO status , Patient's Chart, lab work & pertinent test results  Airway Mallampati: II  TM Distance: <3 FB Neck ROM: Full    Dental  (+) Chipped, Dental Advisory Given,    Pulmonary former smoker   breath sounds clear to auscultation       Cardiovascular hypertension, Pt. on medications  Rhythm:Regular Rate:Normal     Neuro/Psych  Headaches PSYCHIATRIC DISORDERS Anxiety Depression       GI/Hepatic Neg liver ROS,GERD  ,,  Endo/Other  Hypothyroidism    Renal/GU negative Renal ROS     Musculoskeletal negative musculoskeletal ROS (+)    Abdominal   Peds  Hematology negative hematology ROS (+)   Anesthesia Other Findings   Reproductive/Obstetrics                             Anesthesia Physical Anesthesia Plan  ASA: 3  Anesthesia Plan: General   Post-op Pain Management: Tylenol PO (pre-op)* and Toradol IV (intra-op)*   Induction: Intravenous  PONV Risk Score and Plan: 4 or greater and Ondansetron, Dexamethasone, Midazolam and Scopolamine patch - Pre-op  Airway Management Planned: Oral ETT  Additional Equipment: None  Intra-op Plan:   Post-operative Plan: Extubation in OR  Informed Consent: I have reviewed the patients History and Physical, chart, labs and discussed the procedure including the risks, benefits and alternatives for the proposed anesthesia with the patient or authorized representative who has indicated his/her understanding and acceptance.     Dental advisory given  Plan Discussed with: CRNA  Anesthesia Plan Comments:        Anesthesia Quick Evaluation

## 2022-08-02 NOTE — H&P (Signed)
PROVIDER:  Elenora Gamma, MD   MRN: [id number] DOB: April 25, 1965  Subjective    Chief Complaint: New Consultation (NEW PROBLEM)       History of Present Illness: Jody Schultz is a 57 y.o. female who is seen today as hospital follow-up who was admitted for approximately 1 week in March due to perforated appendicitis.  She was placed on IV antibiotics and her pain resolved.  Repeat CT scan showed stable appendix and phlegmon with no drainable abscess.  She was discharged with oral antibiotics.  She completed that course.  She is having intermittent abdominal pain and nausea but this is improving.  She is getting back to a regular diet.  She still is very fatigued.         Past Medical History:  Diagnosis Date   Allergy     Chicken pox     Depression     GERD (gastroesophageal reflux disease)     Hyperlipidemia     Hypertension     Increased frequency of headaches     Thyroid disease           Past Surgical History:  Procedure Laterality Date   CESAREAN SECTION       CYSTECTOMY             Family History  Problem Relation Age of Onset   Arthritis Mother     Stroke Father     Esophageal cancer Father     Cancer Maternal Grandmother     Cancer Paternal Grandmother     Colon cancer Neg Hx     Colon polyps Neg Hx     Stomach cancer Neg Hx     Rectal cancer Neg Hx      Social History         Socioeconomic History   Marital status: Married      Spouse name: Not on file   Number of children: Not on file   Years of education: Not on file   Highest education level: Not on file  Occupational History   Not on file  Tobacco Use   Smoking status: Former      Types: Cigarettes      Quit date: 01/27/1988      Years since quitting: 34.3   Smokeless tobacco: Never  Vaping Use   Vaping Use: Never used  Substance and Sexual Activity   Alcohol use: Yes      Alcohol/week: 1.0 standard drink of alcohol      Types: 1 Standard drinks or equivalent per week       Comment: social   Drug use: No   Sexual activity: Not on file  Other Topics Concern   Not on file  Social History Narrative   Not on file    Social Determinants of Health        Financial Resource Strain: Not on file  Food Insecurity: No Food Insecurity (05/09/2022)    Hunger Vital Sign     Worried About Running Out of Food in the Last Year: Never true     Ran Out of Food in the Last Year: Never true  Transportation Needs: No Transportation Needs (05/09/2022)    PRAPARE - Therapist, art (Medical): No     Lack of Transportation (Non-Medical): No  Physical Activity: Not on file  Stress: Not on file  Social Connections: Not on file  Intimate Partner Violence: Not At Risk (05/09/2022)  Humiliation, Afraid, Rape, and Kick questionnaire     Fear of Current or Ex-Partner: No     Emotionally Abused: No     Physically Abused: No     Sexually Abused: No    Review of Systems - Negative except as stated above   Current Outpatient Medications:    acetaminophen (TYLENOL) 325 MG tablet, Take 2 tablets (650 mg total) by mouth every 6 (six) hours as needed for mild pain or fever (or temp > 100)., Disp: , Rfl:    ALPRAZolam (XANAX) 0.5 MG tablet, Take 0.5 mg by mouth at bedtime., Disp: , Rfl:    amLODipine (NORVASC) 5 MG tablet, Take 1 tablet (5 mg total) by mouth daily., Disp: 30 tablet, Rfl: 0   atorvastatin (LIPITOR) 10 MG tablet, Take 10 mg by mouth at bedtime., Disp: , Rfl:    diclofenac Sodium (VOLTAREN) 1 % GEL, Apply 1 Application topically as needed (pain)., Disp: , Rfl:    ibuprofen (ADVIL) 200 MG tablet, Take 400-600 mg by mouth as needed for moderate pain., Disp: , Rfl:    levothyroxine (SYNTHROID) 125 MCG tablet, Take 125 mcg by mouth daily., Disp: , Rfl:    losartan (COZAAR) 100 MG tablet, Take 100 mg by mouth at bedtime., Disp: , Rfl:    MICONAZOLE 3 COMBO PACK 200 & 2 MG-% (9GM) KIT, Place 1 suppository vaginally at bedtime. (Patient not taking:  Reported on 05/10/2022), Disp: , Rfl:    nitroGLYCERIN (NITROSTAT) 0.4 MG SL tablet, Place 1 tablet (0.4 mg total) under the tongue every 5 (five) minutes as needed for chest pain. (Patient not taking: Reported on 06/24/2019), Disp: 20 tablet, Rfl: 0   ondansetron (ZOFRAN-ODT) 4 MG disintegrating tablet, Take 1 tablet (4 mg total) by mouth every 6 (six) hours as needed for nausea., Disp: 12 tablet, Rfl: 0   VITAMIN D PO, Take 1 capsule by mouth daily., Disp: , Rfl:       Allergies  Allergen Reactions   Levonorgestrel-Ethinyl Estrad Other (See Comments)      Unknown         Objective:       Vitals:   08/02/22 0610  BP: 106/76  Pulse: 68  Resp: 16  Temp: 98.3 F (36.8 C)  SpO2: 96%      General appearance - alert, well appearing, and in no distress CV:RRR Lungs: CTA Abdomen - soft     Labs, Imaging and Diagnostic Testing:   CT scans reviewed.  No appendicolith with dilated appendix.   Last colonoscopy report reviewed July 2021.  1 cecal polyp noted.   Assessment and Plan:  Diagnoses and all orders for this visit:   Appendicitis with perforation     57 year old female with perforated appendicitis treated nonoperatively with IV and oral antibiotics.  Her last colonoscopy was approximately 3 years ago and normal.  Given the severity of her illness, I would recommend proceeding with an interval appendectomy.  We have discussed this in detail.  I would like to wait until she is healed from this episode.  We will plan on doing this in June.  We discussed a laparoscopic approach.  We discussed the possible need for overnight stay depending on the amount of dissection performed to complete her appendectomy.  All questions were answered.  Other risks include bleeding, damage to adjacent structures and infection including anastomotic leak and wound infections.  She is also at high risk for hernia, given her BMI.  Vanita Panda, MD Colon and Rectal Surgery [facility]  Surgery

## 2022-08-02 NOTE — Discharge Instructions (Addendum)
LAPAROSCOPIC SURGERY: POST OP INSTRUCTIONS  DIET: Follow a light bland diet the first 24 hours after arrival home, such as soup, liquids, crackers, etc.  Be sure to include lots of fluids daily.  Avoid fast food or heavy meals as your are more likely to get nauseated.  Eat a low fat the next few days after surgery.   Take your usually prescribed home medications unless otherwise directed. PAIN CONTROL: Pain is best controlled by a usual combination of three different methods TOGETHER: Ice/Heat Over the counter pain medication Prescription pain medication Most patients will experience some swelling and bruising around the incisions.  Ice packs or heating pads (30-60 minutes up to 6 times a day) will help. Use ice for the first few days to help decrease swelling and bruising, then switch to heat to help relax tight/sore spots and speed recovery.  Some people prefer to use ice alone, heat alone, alternating between ice & heat.  Experiment to what works for you.  Swelling and bruising can take several weeks to resolve.   It is helpful to take an over-the-counter pain medication regularly for the first few weeks.  Choose one of the following that works best for you: Naproxen (Aleve, etc)  Two 220mg tabs twice a day Ibuprofen (Advil, etc) Three 200mg tabs four times a day (every meal & bedtime) A  prescription for pain medication (such as percocet, vicodin, oxycodone, hydrocodone, etc) should be given to you upon discharge.  Take your pain medication as prescribed.  If you are having problems/concerns with the prescription medicine (does not control pain, nausea, vomiting, rash, itching, etc), please call us [phone] to see if we need to switch you to a different pain medicine that will work better for you and/or control your side effect better. If you need a refill on your pain medication, please contact your pharmacy.  They will contact our office to request authorization. Prescriptions will not be  filled after 5 pm or on week-ends.   Avoid getting constipated.  Between the surgery and the pain medications, it is common to experience some constipation.  Increasing fluid intake and taking a fiber supplement (such as Metamucil, Citrucel, FiberCon, MiraLax, etc) 1-2 times a day regularly will usually help prevent this problem from occurring.  A mild laxative (prune juice, Milk of Magnesia, MiraLax, etc) should be taken according to package directions if there are no bowel movements after 48 hours.   Watch out for diarrhea.  If you have many loose bowel movements, simplify your diet to bland foods & liquids for a few days.  Stop any stool softeners and decrease your fiber supplement.  Switching to mild anti-diarrheal medications (Kayopectate, Pepto Bismol) can help.  If this worsens or does not improve, please call us. Wash / shower every day.  You may shower over the dressings as they are waterproof.  Continue to shower over incision(s) after the dressing is off. Remove your waterproof bandages 5 days after surgery.  You may leave the incision open to air.  You may replace a dressing/Band-Aid to cover the incision for comfort if you wish.  ACTIVITIES as tolerated:   You may resume regular (light) daily activities beginning the next day--such as daily self-care, walking, climbing stairs--gradually increasing activities as tolerated.  If you can walk 30 minutes without difficulty, it is safe to try more intense activity such as jogging, treadmill, bicycling, low-impact aerobics, swimming, etc. Save the most intensive and strenuous activity for last such as sit-ups, heavy   lifting, contact sports, etc  Refrain from any heavy lifting or straining until you are off narcotics for pain control.   DO NOT PUSH THROUGH PAIN.  Let pain be your guide: If it hurts to do something, don't do it.  Pain is your body warning you to avoid that activity for another week until the pain goes down. You may drive when you are  no longer taking prescription pain medication, you can comfortably wear a seatbelt, and you can safely maneuver your car and apply brakes. You may have sexual intercourse when it is comfortable.  FOLLOW UP in our office Please call [facility] at [phone] to set up an appointment to see your surgeon in the office for a follow-up appointment approximately 2-3 weeks after your surgery. Make sure that you call for this appointment the day you arrive home to insure a convenient appointment time. 10. IF YOU HAVE DISABILITY OR FAMILY LEAVE FORMS, BRING THEM TO THE OFFICE FOR PROCESSING.  DO NOT GIVE THEM TO YOUR DOCTOR.   WHEN TO CALL US [phone]: Poor pain control Reactions / problems with new medications (rash/itching, nausea, etc)  Fever over 101.5 F (38.5 C) Inability to urinate Nausea and/or vomiting Worsening swelling or bruising Continued bleeding from incision. Increased pain, redness, or drainage from the incision   The clinic staff is available to answer your questions during regular business hours (8:30am-5pm).  Please don't hesitate to call and ask to speak to one of our nurses for clinical concerns.   If you have a medical emergency, go to the nearest emergency room or call 911.  A surgeon from [facility] is always on call at the hospitals   [facility] [address], [address], [place], [place]  [address] ? MAIN: [phone] ? TOLL FREE: [phone] ?  FAX [phone] [email or web address]   

## 2022-08-03 ENCOUNTER — Encounter (HOSPITAL_COMMUNITY): Payer: Self-pay | Admitting: General Surgery

## 2022-08-03 LAB — SURGICAL PATHOLOGY

## 2022-12-05 ENCOUNTER — Ambulatory Visit
Admit: 2022-12-05 | Discharge: 2022-12-05 | Disposition: A | Discharge status: Home / Self Care | Payer: No Typology Code available for payment source | Attending: Family Medicine | Admitting: Family Medicine

## 2023-05-30 ENCOUNTER — Telehealth: Payer: Self-pay | Admitting: Urology

## 2023-05-30 NOTE — Telephone Encounter (Signed)
 Pt wanted to set up new patient apt I called and lvm for her to call us back ANN 05/30/23

## 2023-10-05 ENCOUNTER — Encounter (HOSPITAL_BASED_OUTPATIENT_CLINIC_OR_DEPARTMENT_OTHER): Payer: Self-pay

## 2023-10-05 ENCOUNTER — Other Ambulatory Visit: Payer: Self-pay

## 2023-10-05 ENCOUNTER — Emergency Department (HOSPITAL_BASED_OUTPATIENT_CLINIC_OR_DEPARTMENT_OTHER): Admitting: Radiology

## 2023-10-05 ENCOUNTER — Emergency Department (HOSPITAL_BASED_OUTPATIENT_CLINIC_OR_DEPARTMENT_OTHER)
Admission: EM | Admit: 2023-10-05 | Discharge: 2023-10-05 | Disposition: A | Discharge status: Home / Self Care | Attending: Emergency Medicine | Admitting: Emergency Medicine

## 2023-10-05 DIAGNOSIS — R079 Chest pain, unspecified: Secondary | ICD-10-CM

## 2023-10-05 DIAGNOSIS — I10 Essential (primary) hypertension: Secondary | ICD-10-CM | POA: Insufficient documentation

## 2023-10-05 DIAGNOSIS — R0789 Other chest pain: Secondary | ICD-10-CM | POA: Insufficient documentation

## 2023-10-05 DIAGNOSIS — E119 Type 2 diabetes mellitus without complications: Secondary | ICD-10-CM | POA: Insufficient documentation

## 2023-10-05 LAB — BASIC METABOLIC PANEL WITH GFR
Anion gap: 10 (ref 5–15)
BUN: 14 mg/dL (ref 6–20)
CO2: 26 mmol/L (ref 22–32)
Calcium: 9.3 mg/dL (ref 8.9–10.3)
Chloride: 105 mmol/L (ref 98–111)
Creatinine, Ser: 0.97 mg/dL (ref 0.44–1.00)
GFR, Estimated: >60 mL/min (ref >60)
Glucose, Bld: 92 mg/dL (ref 70–99)
Potassium: 4.5 mmol/L (ref 3.5–5.1)
Sodium: 141 mmol/L (ref 135–145)

## 2023-10-05 LAB — CBC
HCT: 41.2 % (ref 36.0–46.0)
Hemoglobin: 13.5 g/dL (ref 12.0–15.0)
MCH: 31.3 pg (ref 26.0–34.0)
MCHC: 32.8 g/dL (ref 30.0–36.0)
MCV: 95.6 fL (ref 80.0–100.0)
Platelets: 289 K/uL (ref 150–400)
RBC: 4.31 MIL/uL (ref 3.87–5.11)
RDW: 12.9 % (ref 11.5–15.5)
WBC: 7.9 K/uL (ref 4.0–10.5)
nRBC: 0 % (ref 0.0–0.2)

## 2023-10-05 LAB — TROPONIN T, HIGH SENSITIVITY
Troponin T High Sensitivity: <15 ng/L (ref <19)
Troponin T High Sensitivity: <15 ng/L (ref <19)

## 2023-10-05 NOTE — ED Provider Notes (Signed)
 [place] EMERGENCY DEPARTMENT AT [facility] Provider Note   CSN: [id number] Arrival date & time: 10/05/23  1043     Patient presents with: Chest Pain   Jody Schultz is a 58 y.o. female.   58 year old female presents with complaint of chest discomfort. Onset Wednesday night woke up with sharp pain in left lower arm with chest pressure and nausea. Symptoms have been intermittent since that time. Also fatigue but unsure if due to not sleeping well. Feeling like needs to take a deep breath in on occasion (cooking dinner, sitting in recliner). Occasional very mild chest tightness (middle of chest). Woke up last night with left hand tingling. No weakness. Occasional sweats, unsure if due to weight/menopause.  Reports symptoms occur sometimes with exertion, sometimes not, symptoms improved with rest, sometimes pain occurs at rest.  History of HLD, HTN. No history DM. Brother with MI x 2 (onset mid 52s yo).   Non smoker.        Prior to Admission medications   Medication Sig Start Date End Date Taking? Authorizing Provider  acetaminophen  (TYLENOL ) 500 MG tablet Take 1,000 mg by mouth every 6 (six) hours as needed (pain.).    [provider]  ALPRAZolam (XANAX) 0.5 MG tablet Take 0.5 mg by mouth at bedtime.    [provider]  amLODipine  (NORVASC ) 5 MG tablet Take 1 tablet (5 mg total) by mouth daily. Patient taking differently: Take 5 mg by mouth every evening. 09/19/16 08/02/22  Verdene Purchase, MD  atorvastatin  (LIPITOR) 10 MG tablet Take 10 mg by mouth every evening.    [provider]  diclofenac Sodium (VOLTAREN) 1 % GEL Apply 1 Application topically as needed (pain).    [provider]  ibuprofen (ADVIL) 200 MG tablet Take 400-600 mg by mouth every 8 (eight) hours as needed (pain.).    [provider]  levothyroxine  (SYNTHROID ) 125 MCG tablet Take 125 mcg by mouth daily before breakfast. 05/23/19   [provider]   losartan  (COZAAR ) 100 MG tablet Take 100 mg by mouth every evening. 05/23/19   [provider]  traMADol  (ULTRAM ) 50 MG tablet Take 1-2 tablets (50-100 mg total) by mouth every 6 (six) hours as needed. 08/02/22   Debby Hila, MD  VITAMIN D  PO Take 1 capsule by mouth in the morning.    [provider]    Allergies: Levonorgestrel-ethinyl estrad    Review of Systems Negative except as per HPI Updated Vital Signs BP (!) 121/90   Pulse (!) 53   Temp 97.7 F (36.5 C) (Oral)   Resp 18   Ht 5' 3 (1.6 m)   Wt 127 kg   LMP 01/16/2019   SpO2 95%   BMI 49.60 kg/m   Physical Exam Vitals and nursing note reviewed.  Constitutional:      General: She is not in acute distress.    Appearance: She is well-developed. She is not diaphoretic.  HENT:     Head: Normocephalic and atraumatic.  Cardiovascular:     Rate and Rhythm: Normal rate and regular rhythm.     Heart sounds: Normal heart sounds.  Pulmonary:     Effort: Pulmonary effort is normal.     Breath sounds: Normal breath sounds.  Chest:     Chest wall: No tenderness.  Abdominal:     Palpations: Abdomen is soft.     Tenderness: There is no abdominal tenderness.  Musculoskeletal:     Right lower leg: No tenderness.  No edema.     Left lower leg: No tenderness. No edema.  Skin:    General: Skin is warm and dry.     Findings: No erythema or rash.  Neurological:     Mental Status: She is alert and oriented to person, place, and time.  Psychiatric:        Behavior: Behavior normal.     (all labs ordered are listed, but only abnormal results are displayed) Labs Reviewed  BASIC METABOLIC PANEL WITH GFR  CBC  TROPONIN T, HIGH SENSITIVITY  TROPONIN T, HIGH SENSITIVITY    EKG: EKG Interpretation Date/Time:  Saturday October 05 2023 10:57:11 EDT Ventricular Rate:  60 PR Interval:  143 QRS Duration:  99 QT Interval:  407 QTC Calculation: 407 R Axis:   29  Text Interpretation: Sinus rhythm Low voltage,  precordial leads since last tracing no significant change Confirmed by Lenor Hollering [phone]) on 10/05/2023 11:29:32 AM  Radiology: [facility] Chest 2 View Result Date: 10/05/2023 EXAM: 2 VIEW(S) XRAY OF THE CHEST 10/05/2023 11:11:46 AM COMPARISON: 05/03/2022 CLINICAL HISTORY: Chest pain. FINDINGS: LUNGS AND PLEURA: Mildly low lung volumes, accentuating the pulmonary interstitium. No focal pulmonary opacity. No pulmonary edema. No pleural effusion. No pneumothorax. HEART AND MEDIASTINUM: No acute abnormality of the cardiac and mediastinal silhouettes. BONES AND SOFT TISSUES: No acute osseous abnormality. Multiple wires and leads project over the chest on the frontal radiograph. IMPRESSION: 1. No acute process. 2. Mildly low lung volumes, accentuating the pulmonary interstitium. Electronically signed by: Rockey Kilts MD 10/05/2023 11:31 AM EDT RP Workstation: [id number]     Procedures   Medications Ordered in the ED - No data to display                                  Medical Decision Making Amount and/or Complexity of Data Reviewed Labs: ordered. Radiology: ordered.   This patient presents to the ED for concern of chest discomfort, this involves an extensive number of treatment options, and is a complaint that carries with it a high risk of complications and morbidity.  The differential diagnosis includes but not limited to ACS, pneumonia, arrhythmia, metabolic or electrolyte   Co morbidities / Chronic conditions that complicate the patient evaluation  Hypothyroid, GERD, obesity   Additional history obtained:  Additional history obtained from EMR External records from outside source obtained and reviewed including prior labs on file   Lab Tests:  I Ordered, and personally interpreted labs.  The pertinent results include: CBC within normals.,  BMP within normal is.  Troponins negative x 2.   Imaging Studies ordered:  I ordered imaging studies including chest x-ray I independently  visualized and interpreted imaging which showed unremarkable I agree with the radiologist interpretation   Cardiac Monitoring: / EKG:  The patient was maintained on a cardiac monitor.  I personally viewed and interpreted the cardiac monitored which showed an underlying rhythm of: Sinus rhythm, rate 60   Problem List / ED Course / Critical interventions / Medication management  58 year old female presents with vague chest discomfort as above.  No pain at this time.  Workup largely reassuring.  Provided with ambulatory referral to cardiology and return to ER precautions. I have reviewed the patients home medicines and have made adjustments as needed   Social Determinants of Health:  Has PCP   Test / Admission - Considered:  Stable for discharge with ambulatory referral to cardiology  Final diagnoses:  Chest pain, unspecified type    ED Discharge Orders          Ordered    Ambulatory referral to Cardiology       Comments: If you have not heard from the Cardiology office within the next 72 hours please call [phone].   10/05/23 1423               Beverley Leita LABOR, PA-C 10/05/23 1643    Lenor Hollering, MD 10/06/23 [phone]

## 2023-10-05 NOTE — Discharge Instructions (Signed)
 Return to the ER for any worsening or concerning symptoms. Follow up with cardiology, you should be contacted early next week to arrange follow up next week.

## 2023-10-05 NOTE — ED Triage Notes (Signed)
 Pt POV from home c/o chest pain x 3 days that began while sitting at rest, she felt palpitations, has had intermittent chest tightness, dizziness, some nausea, and fatigue. Pt said no one symptom was severe enough to be seen. Not currently experiencing pain/discomfort.

## 2023-12-26 LAB — LAB REPORT - SCANNED: EGFR: 94

## 2024-01-10 ENCOUNTER — Encounter (HOSPITAL_BASED_OUTPATIENT_CLINIC_OR_DEPARTMENT_OTHER): Payer: Self-pay | Admitting: Cardiology

## 2024-01-10 ENCOUNTER — Ambulatory Visit (INDEPENDENT_AMBULATORY_CARE_PROVIDER_SITE_OTHER): Admitting: Cardiology

## 2024-01-10 VITALS — BP 110/80 | HR 72 | Ht 63.0 in | Wt 297.2 lb

## 2024-01-10 DIAGNOSIS — I10 Essential (primary) hypertension: Secondary | ICD-10-CM | POA: Diagnosis not present

## 2024-01-10 DIAGNOSIS — Z7189 Other specified counseling: Secondary | ICD-10-CM

## 2024-01-10 DIAGNOSIS — R079 Chest pain, unspecified: Secondary | ICD-10-CM | POA: Diagnosis not present

## 2024-01-10 DIAGNOSIS — Z7689 Persons encountering health services in other specified circumstances: Secondary | ICD-10-CM

## 2024-01-10 DIAGNOSIS — R072 Precordial pain: Secondary | ICD-10-CM

## 2024-01-10 DIAGNOSIS — E7800 Pure hypercholesterolemia, unspecified: Secondary | ICD-10-CM

## 2024-01-10 NOTE — Patient Instructions (Addendum)
 Medication Instructions:  No changes *If you need a refill on your cardiac medications before your next appointment, please call your pharmacy*  Lab Work: none If you have labs (blood work) drawn today and your tests are completely normal, you will receive your results only by: MyChart Message (if you have MyChart) OR A paper copy in the mail If you have any lab test that is abnormal or we need to change your treatment, we will call you to review the results.  Testing/Procedures: Coronary CT Angiogram ~ see instructions below  Follow-Up: As needed  Other Instructions   Your cardiac CT will be scheduled at  [facility] [address] [place], [place] [address] [phone] (Severe contrast allergies only)   [facility] [address]  [place], [place] [address]  [facility] and [facility] at [employer]  please enter the parking lot using the [address] street entrance and use the FREE valet service at the patient drop-off area. Enter the building and check-in with registration on the main floor.   Please follow these instructions carefully (unless otherwise directed):  An IV will be required for this test and Nitroglycerin  will be given.   On the Night Before the Test: Be sure to Drink plenty of water. Do not consume any caffeinated/decaffeinated beverages or chocolate 12 hours prior to your test. Do not take any antihistamines 12 hours prior to your test.  On the Day of the Test: Drink plenty of water until 1 hour prior to the test. Do not eat any food 1 hour prior to test. You may take your regular medications prior to the test.  Patients who wear a continuous glucose monitor MUST remove the device prior to scanning. FEMALES- please wear underwire-free bra if available, avoid dresses & tight clothing      After the Test: Drink plenty of water. After receiving IV contrast, you may experience a mild flushed feeling. This  is normal. On occasion, you may experience a mild rash up to 24 hours after the test. This is not dangerous. If this occurs, you can take Benadryl 25 mg, Zyrtec, Claritin, or Allegra and increase your fluid intake. (Patients taking Tikosyn should avoid Benadryl, and may take Zyrtec, Claritin, or Allegra) If you experience trouble breathing, this can be serious. If it is severe call [phone] IMMEDIATELY. If it is mild, please call our office.  We will call to schedule your test 2-4 weeks out understanding that some insurance companies will need an authorization prior to the service being performed.   For more information and frequently asked questions, please visit our website : [email or web address]  For non-scheduling related questions, please contact the cardiac imaging nurse navigator should you have any questions/concerns: Cardiac Imaging Nurse Navigators Direct Office Dial: [phone]   For scheduling needs, including cancellations and rescheduling, please call Brittany, [phone].

## 2024-01-10 NOTE — Progress Notes (Signed)
 Cardiology Office Note:  .   Date:  01/10/2024  ID:  [name], DOB 03/20/1965, MRN [id number] PCP: Waylan Almarie SAUNDERS, MD  [facility] Providers Cardiologist:  Shelda Bruckner, MD {  History of Present Illness: .   Jody Schultz is a 58 y.o. female with PMH hypertension, hyperlipidemia, obesity, hypothyroidism, former tobacco use. She is seen for new patient consultation at the request of Leita Chancy, PA for chest pain. Referral from 10/05/23 reviewed, including ER evaluation.  ER notes report sharp pain in left lower arm with chest pressure and nausea. hsTn normal. ECG without ischemic changes.  Pertinent CV history: Seen in 2018 for chest pain. Echo 09/19/2016 with normal function/wall motion, valves not well visualized but no significant disease by doppler. She was ordered a CT coronary at that time but it does not appear this was completed.  FH: brother with two Mis in his mid 69s.  She has not had any severe pressure in her chest. Does wake up with arm tingling, more right sided than left. She has had intermittent tightness in her chest, up to a few times a day, which is what brought her to the ER. Doesn't feel short of breath, but feels like she has to take deep breaths. Had occasional racing heartbeats around the time of the ER presentation but none recently. No clear aggravating/alleviating factors other than resting made it better.  Blood pressures well controlled at home. Reviewed lipids, see below.  ROS: Denies shortness of breath at rest or with normal exertion. No PND, orthopnea, LE edema or unexpected weight gain. No syncope. ROS otherwise negative except as noted.   Studies Reviewed: SABRA    EKG:       Physical Exam:   VS:  BP 110/80   Pulse 72   Ht 5' 3 (1.6 m)   Wt 297 lb 3.2 oz (134.8 kg)   LMP 01/16/2019   SpO2 95%   BMI 52.65 kg/m    Wt Readings from Last 3 Encounters:  01/10/24 297 lb 3.2 oz (134.8 kg)  10/05/23 280 lb (127 kg)  08/02/22  268 lb (121.6 kg)    GEN: Well nourished, well developed in no acute distress HEENT: Normal, moist mucous membranes NECK: No JVD CARDIAC: regular rhythm, normal S1 and S2, no rubs or gallops. No murmur. VASCULAR: Radial and DP pulses 2+ bilaterally. No carotid bruits RESPIRATORY:  Clear to auscultation without rales, wheezing or rhonchi  ABDOMEN: Soft, non-tender, non-distended MUSCULOSKELETAL:  Ambulates independently SKIN: Warm and dry, no edema NEUROLOGIC:  Alert and oriented x 3. No focal neuro deficits noted. PSYCHIATRIC:  Normal affect    ASSESSMENT AND PLAN: .    Chest pain -discussed treadmill stress, nuclear stress/lexiscan, and CT coronary angiography. Discussed pros and cons of each, including but not limited to false positive/false negative risk, radiation risk, and risk of IV contrast dye. Based on shared decision making, decision was made to pursue CT coronary angiography. -HR ~70 today, on her smartwatch runs about 60 -Cr 0.73 on 12/18/23 per [facility] -counseled on use of sublingual nitroglycerin  and its importance to a good test -reviewed red flag warning signs that need immediate medical attention   Hypertension -continue amlodipine  5 mg daily, losartan  100 mg daily  Hypercholesterolemia -on atorvastatin  10 mg -per [facility], lipids 12/18/23 Tchol 180, HDL 62, LDL 97, TG 116  Obesity, BMI 52 -if CAD found, would be a potential candidate for GLP if she is interested  CV risk counseling and prevention -recommend  heart healthy/Mediterranean diet, with whole grains, fruits, vegetable, fish, lean meats, nuts, and olive oil. Limit salt. -recommend moderate walking, 3-5 times/week for 30-50 minutes each session. Aim for at least 150 minutes/week. Goal should be pace of 3 miles/hours, or walking 1.5 miles in 30 minutes -recommend avoidance of tobacco products. Avoid excess alcohol.  Dispo: to be determined based on testing; if CT without significant CAD, will give recommendations  on cholesterol goals and she can see cardiology as needed  Signed, Shelda Bruckner, MD   Shelda Bruckner, MD, PhD, [facility] [place]  [facility]  [place]  [facility] & Vascular at [facility] at [facility] [address] [place], [place] [address] [phone]
